# Patient Record
Sex: Female | Born: 1945 | Race: White | Hispanic: No | Marital: Married | Smoking: Never smoker
Health system: Southern US, Community
[De-identification: ages and names within clinical notes are randomized; demographics above are authoritative.]

## PROBLEM LIST (undated history)

## (undated) DIAGNOSIS — M1990 Unspecified osteoarthritis, unspecified site: Secondary | ICD-10-CM

## (undated) DIAGNOSIS — K219 Gastro-esophageal reflux disease without esophagitis: Secondary | ICD-10-CM

## (undated) DIAGNOSIS — E785 Hyperlipidemia, unspecified: Secondary | ICD-10-CM

## (undated) DIAGNOSIS — H269 Unspecified cataract: Secondary | ICD-10-CM

## (undated) DIAGNOSIS — Z5189 Encounter for other specified aftercare: Secondary | ICD-10-CM

## (undated) DIAGNOSIS — G4730 Sleep apnea, unspecified: Secondary | ICD-10-CM

## (undated) DIAGNOSIS — G709 Myoneural disorder, unspecified: Secondary | ICD-10-CM

## (undated) DIAGNOSIS — M4800 Spinal stenosis, site unspecified: Secondary | ICD-10-CM

## (undated) DIAGNOSIS — T8859XA Other complications of anesthesia, initial encounter: Secondary | ICD-10-CM

## (undated) DIAGNOSIS — T4145XA Adverse effect of unspecified anesthetic, initial encounter: Secondary | ICD-10-CM

## (undated) DIAGNOSIS — I10 Essential (primary) hypertension: Secondary | ICD-10-CM

## (undated) HISTORY — PX: CARPAL TUNNEL RELEASE: SHX101

## (undated) HISTORY — DX: Unspecified cataract: H26.9

## (undated) HISTORY — PX: COLONOSCOPY: SHX174

## (undated) HISTORY — PX: CATARACT EXTRACTION, BILATERAL: SHX1313

## (undated) HISTORY — DX: Myoneural disorder, unspecified: G70.9

## (undated) HISTORY — DX: Encounter for other specified aftercare: Z51.89

## (undated) HISTORY — PX: ACHILLES TENDON REPAIR: SUR1153

## (undated) HISTORY — DX: Sleep apnea, unspecified: G47.30

---

## 1998-06-07 ENCOUNTER — Ambulatory Visit (HOSPITAL_BASED_OUTPATIENT_CLINIC_OR_DEPARTMENT_OTHER): Admission: RE | Admit: 1998-06-07 | Discharge: 1998-06-07 | Payer: Self-pay | Admitting: Orthopedic Surgery

## 2002-12-31 ENCOUNTER — Encounter: Admission: RE | Admit: 2002-12-31 | Discharge: 2002-12-31 | Payer: Self-pay | Admitting: Specialist

## 2002-12-31 ENCOUNTER — Encounter: Payer: Self-pay | Admitting: Specialist

## 2003-03-16 ENCOUNTER — Other Ambulatory Visit: Admission: RE | Admit: 2003-03-16 | Discharge: 2003-03-16 | Payer: Self-pay | Admitting: Family Medicine

## 2004-06-14 ENCOUNTER — Ambulatory Visit: Payer: Self-pay | Admitting: Family Medicine

## 2004-08-01 ENCOUNTER — Ambulatory Visit: Payer: Self-pay | Admitting: Family Medicine

## 2004-11-20 ENCOUNTER — Ambulatory Visit: Payer: Self-pay | Admitting: Family Medicine

## 2004-11-27 ENCOUNTER — Ambulatory Visit: Payer: Self-pay | Admitting: Family Medicine

## 2005-03-22 ENCOUNTER — Ambulatory Visit: Payer: Self-pay | Admitting: Family Medicine

## 2005-05-22 ENCOUNTER — Ambulatory Visit: Payer: Self-pay | Admitting: Family Medicine

## 2012-06-02 ENCOUNTER — Other Ambulatory Visit: Payer: Self-pay | Admitting: Specialist

## 2012-06-02 DIAGNOSIS — M4800 Spinal stenosis, site unspecified: Secondary | ICD-10-CM

## 2012-06-02 DIAGNOSIS — M545 Low back pain: Secondary | ICD-10-CM

## 2012-06-10 ENCOUNTER — Other Ambulatory Visit: Payer: Self-pay

## 2012-06-13 ENCOUNTER — Inpatient Hospital Stay
Admission: RE | Admit: 2012-06-13 | Discharge: 2012-06-13 | Disposition: A | Discharge status: Home / Self Care | Payer: Self-pay | Source: Ambulatory Visit | Attending: Specialist | Admitting: Specialist

## 2012-06-13 ENCOUNTER — Ambulatory Visit
Admission: RE | Admit: 2012-06-13 | Discharge: 2012-06-13 | Disposition: A | Discharge status: Home / Self Care | Payer: BC Managed Care – PPO | Source: Ambulatory Visit | Attending: Specialist | Admitting: Specialist

## 2012-06-13 ENCOUNTER — Other Ambulatory Visit: Payer: Self-pay | Admitting: Specialist

## 2012-06-13 ENCOUNTER — Other Ambulatory Visit: Payer: Self-pay

## 2012-06-13 DIAGNOSIS — M545 Low back pain: Secondary | ICD-10-CM

## 2012-06-13 DIAGNOSIS — M4800 Spinal stenosis, site unspecified: Secondary | ICD-10-CM

## 2012-06-13 DIAGNOSIS — R52 Pain, unspecified: Secondary | ICD-10-CM

## 2012-06-16 ENCOUNTER — Ambulatory Visit
Admission: RE | Admit: 2012-06-16 | Discharge: 2012-06-16 | Disposition: A | Discharge status: Home / Self Care | Payer: BC Managed Care – PPO | Source: Ambulatory Visit | Attending: Specialist | Admitting: Specialist

## 2012-06-16 VITALS — BP 129/80 | HR 83

## 2012-06-16 DIAGNOSIS — M545 Low back pain: Secondary | ICD-10-CM

## 2012-06-16 DIAGNOSIS — M4800 Spinal stenosis, site unspecified: Secondary | ICD-10-CM

## 2012-06-16 MED ORDER — IOHEXOL 180 MG/ML  SOLN: 15.0000 mL | Freq: Once | INTRAMUSCULAR | Status: DC | PRN

## 2012-06-16 MED ORDER — DIAZEPAM 5 MG PO TABS
5.0000 mg | ORAL_TABLET | Freq: Once | ORAL | Status: AC
  Administered 2012-06-16: 5 mg via ORAL

## 2012-06-16 NOTE — Progress Notes (Signed)
Patient states she has been Nortriptyline for at least the past two days.  Donell Sievert, RN

## 2012-07-08 ENCOUNTER — Other Ambulatory Visit: Payer: Self-pay | Admitting: Orthopedic Surgery

## 2012-07-17 ENCOUNTER — Encounter (HOSPITAL_COMMUNITY): Payer: Self-pay | Admitting: Pharmacy Technician

## 2012-07-21 NOTE — Progress Notes (Signed)
Chest x ray, EKG, OV Dr Thomasena Edis 06/30/12  chart

## 2012-07-21 NOTE — Patient Instructions (Addendum)
[name]  07/21/2012   Your procedure is scheduled on:  07/30/12  [facility]  Report to [facility] at 0530      AM.  Call this number if you have problems the morning of surgery: [phone]       Remember:   Do not eat food  Or drink :After Midnight.TUESDAY NIGHT   Take these medicines the morning of surgery with A SIP OF WATER:NONE   .  Contacts, dentures or partial plates can not be worn to surgery  Leave suitcase in the car. After surgery it may be brought to your room.  For patients admitted to the hospital, checkout time is 11:00 AM day of  discharge.             SPECIAL INSTRUCTIONS- SEE [place] PREPARING FOR SURGERY INSTRUCTION SHEET-     DO NOT WEAR JEWELRY, LOTIONS, POWDERS, OR PERFUMES.  WOMEN-- DO NOT SHAVE LEGS OR UNDERARMS FOR 12 HOURS BEFORE SHOWERS. MEN MAY SHAVE FACE.  Patients discharged the day of surgery will not be allowed to drive home. IF going home the day of surgery, you must have a driver and someone to stay with you for the first 24 hours  Name and phone number of your driver:   admission     Husband [name]                                                                  Please read over the following fact sheets that you were given: MRSA Information, Incentive Spirometry Sheet,                                                                                  [name]  PST 336  [phone]                 FAILURE TO FOLLOW THESE INSTRUCTIONS MAY RESULT IN  CANCELLATION   OF YOUR SURGERY                                                  Patient Signature _____________________________

## 2012-07-22 ENCOUNTER — Encounter (HOSPITAL_COMMUNITY): Payer: Self-pay

## 2012-07-22 ENCOUNTER — Ambulatory Visit (HOSPITAL_COMMUNITY)
Admission: RE | Admit: 2012-07-22 | Discharge: 2012-07-22 | Disposition: A | Discharge status: Home / Self Care | Payer: BC Managed Care – PPO | Source: Ambulatory Visit | Attending: Orthopedic Surgery | Admitting: Orthopedic Surgery

## 2012-07-22 ENCOUNTER — Encounter (HOSPITAL_COMMUNITY)
Admission: RE | Admit: 2012-07-22 | Discharge: 2012-07-22 | Disposition: A | Discharge status: Home / Self Care | Payer: BC Managed Care – PPO | Source: Ambulatory Visit | Attending: Specialist | Admitting: Specialist

## 2012-07-22 DIAGNOSIS — Z01812 Encounter for preprocedural laboratory examination: Secondary | ICD-10-CM | POA: Insufficient documentation

## 2012-07-22 DIAGNOSIS — M48061 Spinal stenosis, lumbar region without neurogenic claudication: Secondary | ICD-10-CM | POA: Insufficient documentation

## 2012-07-22 HISTORY — DX: Adverse effect of unspecified anesthetic, initial encounter: T41.45XA

## 2012-07-22 HISTORY — DX: Other complications of anesthesia, initial encounter: T88.59XA

## 2012-07-22 HISTORY — DX: Spinal stenosis, site unspecified: M48.00

## 2012-07-22 HISTORY — DX: Hyperlipidemia, unspecified: E78.5

## 2012-07-22 HISTORY — DX: Gastro-esophageal reflux disease without esophagitis: K21.9

## 2012-07-22 HISTORY — DX: Unspecified osteoarthritis, unspecified site: M19.90

## 2012-07-22 HISTORY — DX: Essential (primary) hypertension: I10

## 2012-07-22 LAB — CBC
Hemoglobin: 14.6 g/dL (ref 12.0–15.0)
MCH: 31.5 pg (ref 26.0–34.0)
MCHC: 33 g/dL (ref 30.0–36.0)
MCV: 95.5 fL (ref 78.0–100.0)
Platelets: 364 10*3/uL (ref 150–400)
RBC: 4.63 MIL/uL (ref 3.87–5.11)

## 2012-07-22 LAB — BASIC METABOLIC PANEL
BUN: 16 mg/dL (ref 6–23)
CO2: 27 mEq/L (ref 19–32)
Calcium: 10.3 mg/dL (ref 8.4–10.5)
Glucose, Bld: 94 mg/dL (ref 70–99)
Sodium: 140 mEq/L (ref 135–145)

## 2012-07-29 NOTE — Anesthesia Preprocedure Evaluation (Addendum)
Anesthesia Evaluation  Patient identified by MRN, date of birth, ID band Patient awake    Reviewed: Allergy & Precautions, H&P , NPO status , Patient's Chart, lab work & pertinent test results  History of Anesthesia Complications (+) PROLONGED EMERGENCE  Airway Mallampati: II TM Distance: >3 FB Neck ROM: Full    Dental  (+) Partial Upper, Partial Lower and Dental Advisory Given   Pulmonary neg pulmonary ROS,  breath sounds clear to auscultation  Pulmonary exam normal       Cardiovascular hypertension, Pt. on medications negative cardio ROS  Rhythm:Regular Rate:Normal     Neuro/Psych Spinal stenosis negative neurological ROS  negative psych ROS   GI/Hepatic Neg liver ROS, GERD-  Medicated,  Endo/Other  negative endocrine ROS  Renal/GU negative Renal ROS  negative genitourinary   Musculoskeletal negative musculoskeletal ROS (+)   Abdominal   Peds  Hematology negative hematology ROS (+)   Anesthesia Other Findings   Reproductive/Obstetrics negative OB ROS                          Anesthesia Physical Anesthesia Plan  ASA: II  Anesthesia Plan: General   Post-op Pain Management:    Induction: Intravenous  Airway Management Planned: Oral ETT  Additional Equipment:   Intra-op Plan:   Post-operative Plan: Extubation in OR  Informed Consent: I have reviewed the patients History and Physical, chart, labs and discussed the procedure including the risks, benefits and alternatives for the proposed anesthesia with the patient or authorized representative who has indicated his/her understanding and acceptance.   Dental advisory given  Plan Discussed with: CRNA  Anesthesia Plan Comments:         Anesthesia Quick Evaluation

## 2012-07-30 ENCOUNTER — Encounter (HOSPITAL_COMMUNITY)
Admission: RE | Disposition: A | Discharge status: Home / Self Care | Payer: Self-pay | Source: Ambulatory Visit | Attending: Specialist

## 2012-07-30 ENCOUNTER — Ambulatory Visit (HOSPITAL_COMMUNITY): Payer: BC Managed Care – PPO

## 2012-07-30 ENCOUNTER — Encounter (HOSPITAL_COMMUNITY): Payer: Self-pay | Admitting: *Deleted

## 2012-07-30 ENCOUNTER — Ambulatory Visit (HOSPITAL_COMMUNITY): Payer: BC Managed Care – PPO | Admitting: Anesthesiology

## 2012-07-30 ENCOUNTER — Inpatient Hospital Stay (HOSPITAL_COMMUNITY)
Admission: RE | Admit: 2012-07-30 | Discharge: 2012-08-01 | DRG: 758 | Disposition: A | Discharge status: Home / Self Care | Payer: BC Managed Care – PPO | Source: Ambulatory Visit | Attending: Specialist | Admitting: Specialist

## 2012-07-30 ENCOUNTER — Encounter (HOSPITAL_COMMUNITY): Payer: Self-pay | Admitting: Anesthesiology

## 2012-07-30 DIAGNOSIS — M129 Arthropathy, unspecified: Secondary | ICD-10-CM | POA: Diagnosis present

## 2012-07-30 DIAGNOSIS — M48062 Spinal stenosis, lumbar region with neurogenic claudication: Principal | ICD-10-CM | POA: Diagnosis present

## 2012-07-30 DIAGNOSIS — I10 Essential (primary) hypertension: Secondary | ICD-10-CM | POA: Diagnosis present

## 2012-07-30 DIAGNOSIS — K219 Gastro-esophageal reflux disease without esophagitis: Secondary | ICD-10-CM | POA: Diagnosis present

## 2012-07-30 DIAGNOSIS — Z79899 Other long term (current) drug therapy: Secondary | ICD-10-CM

## 2012-07-30 DIAGNOSIS — Z885 Allergy status to narcotic agent status: Secondary | ICD-10-CM

## 2012-07-30 DIAGNOSIS — E785 Hyperlipidemia, unspecified: Secondary | ICD-10-CM | POA: Diagnosis present

## 2012-07-30 DIAGNOSIS — R110 Nausea: Secondary | ICD-10-CM | POA: Diagnosis not present

## 2012-07-30 DIAGNOSIS — Z7982 Long term (current) use of aspirin: Secondary | ICD-10-CM

## 2012-07-30 HISTORY — PX: LUMBAR LAMINECTOMY/DECOMPRESSION MICRODISCECTOMY: SHX5026

## 2012-07-30 SURGERY — LUMBAR LAMINECTOMY/DECOMPRESSION MICRODISCECTOMY 2 LEVELS
Anesthesia: General | Wound class: Clean

## 2012-07-30 MED ORDER — PROPOFOL 10 MG/ML IV BOLUS
INTRAVENOUS | Status: DC | PRN
  Administered 2012-07-30: 120 mg via INTRAVENOUS

## 2012-07-30 MED ORDER — LIDOCAINE HCL (CARDIAC) 20 MG/ML IV SOLN
INTRAVENOUS | Status: DC | PRN
  Administered 2012-07-30: 50 mg via INTRAVENOUS

## 2012-07-30 MED ORDER — CEFAZOLIN SODIUM-DEXTROSE 2-3 GM-% IV SOLR
2.0000 g | INTRAVENOUS | Status: AC
  Administered 2012-07-30: 2 g via INTRAVENOUS

## 2012-07-30 MED ORDER — LISINOPRIL 5 MG PO TABS
5.0000 mg | ORAL_TABLET | Freq: Every day | ORAL | Status: DC
  Filled 2012-07-30 (×3): qty 1

## 2012-07-30 MED ORDER — BISACODYL 5 MG PO TBEC
5.0000 mg | DELAYED_RELEASE_TABLET | ORAL | Status: DC
  Filled 2012-07-30: qty 1

## 2012-07-30 MED ORDER — DEXAMETHASONE SODIUM PHOSPHATE 4 MG/ML IJ SOLN
INTRAMUSCULAR | Status: DC | PRN
  Administered 2012-07-30: 10 mg via INTRAVENOUS

## 2012-07-30 MED ORDER — PHENOL 1.4 % MT LIQD: 1.0000 | OROMUCOSAL | Status: DC | PRN

## 2012-07-30 MED ORDER — BUPIVACAINE-EPINEPHRINE 0.5% -1:200000 IJ SOLN
INTRAMUSCULAR | Status: DC | PRN
  Administered 2012-07-30: 20 mL

## 2012-07-30 MED ORDER — NORTRIPTYLINE HCL 25 MG PO CAPS
25.0000 mg | ORAL_CAPSULE | Freq: Every day | ORAL | Status: DC
  Administered 2012-07-30 – 2012-07-31 (×2): 25 mg via ORAL
  Filled 2012-07-30 (×5): qty 1

## 2012-07-30 MED ORDER — ACETAMINOPHEN 10 MG/ML IV SOLN
INTRAVENOUS | Status: DC | PRN
  Administered 2012-07-30: 1000 mg via INTRAVENOUS

## 2012-07-30 MED ORDER — MORPHINE SULFATE 10 MG/ML IJ SOLN: 1.0000 mg | INTRAMUSCULAR | Status: DC | PRN

## 2012-07-30 MED ORDER — HYDROMORPHONE HCL PF 1 MG/ML IJ SOLN
0.2500 mg | INTRAMUSCULAR | Status: DC | PRN
  Administered 2012-07-30: 0.5 mg via INTRAVENOUS

## 2012-07-30 MED ORDER — ACETAMINOPHEN 10 MG/ML IV SOLN
INTRAVENOUS | Status: AC
  Filled 2012-07-30: qty 100

## 2012-07-30 MED ORDER — THROMBIN 5000 UNITS EX SOLR
CUTANEOUS | Status: DC | PRN
  Administered 2012-07-30: 10000 [IU] via TOPICAL

## 2012-07-30 MED ORDER — ACETAMINOPHEN 325 MG PO TABS: 650.0000 mg | ORAL_TABLET | ORAL | Status: DC | PRN

## 2012-07-30 MED ORDER — KCL IN DEXTROSE-NACL 20-5-0.9 MEQ/L-%-% IV SOLN
INTRAVENOUS | Status: AC
  Administered 2012-07-30 – 2012-07-31 (×2): via INTRAVENOUS
  Filled 2012-07-30 (×2): qty 1000

## 2012-07-30 MED ORDER — SODIUM CHLORIDE 0.9 % IV BOLUS (SEPSIS)
500.0000 mL | Freq: Once | INTRAVENOUS | Status: AC
  Administered 2012-07-30: 500 mL via INTRAVENOUS

## 2012-07-30 MED ORDER — LACTATED RINGERS IV SOLN
INTRAVENOUS | Status: DC | PRN
  Administered 2012-07-30 (×2): via INTRAVENOUS

## 2012-07-30 MED ORDER — METOCLOPRAMIDE HCL 5 MG/ML IJ SOLN
INTRAMUSCULAR | Status: DC | PRN
  Administered 2012-07-30: 10 mg via INTRAVENOUS

## 2012-07-30 MED ORDER — SODIUM CHLORIDE 0.9 % IV SOLN: 250.0000 mL | INTRAVENOUS | Status: DC

## 2012-07-30 MED ORDER — CEFAZOLIN SODIUM-DEXTROSE 2-3 GM-% IV SOLR
INTRAVENOUS | Status: AC
  Filled 2012-07-30: qty 50

## 2012-07-30 MED ORDER — GLYCOPYRROLATE 0.2 MG/ML IJ SOLN
INTRAMUSCULAR | Status: DC | PRN
  Administered 2012-07-30: 0.6 mg via INTRAVENOUS

## 2012-07-30 MED ORDER — BUPIVACAINE-EPINEPHRINE (PF) 0.5% -1:200000 IJ SOLN
INTRAMUSCULAR | Status: AC
  Filled 2012-07-30: qty 10

## 2012-07-30 MED ORDER — LACTATED RINGERS IV SOLN: INTRAVENOUS | Status: DC

## 2012-07-30 MED ORDER — FENTANYL CITRATE 0.05 MG/ML IJ SOLN
INTRAMUSCULAR | Status: DC | PRN
  Administered 2012-07-30 (×3): 25 ug via INTRAVENOUS
  Administered 2012-07-30 (×3): 50 ug via INTRAVENOUS
  Administered 2012-07-30: 25 ug via INTRAVENOUS

## 2012-07-30 MED ORDER — ONDANSETRON HCL 4 MG PO TABS: 4.0000 mg | ORAL_TABLET | Freq: Three times a day (TID) | ORAL | Status: DC | PRN

## 2012-07-30 MED ORDER — ACETAMINOPHEN 650 MG RE SUPP: 650.0000 mg | RECTAL | Status: DC | PRN

## 2012-07-30 MED ORDER — HYDROCODONE-ACETAMINOPHEN 5-325 MG PO TABS: 1.0000 | ORAL_TABLET | ORAL | Status: DC | PRN

## 2012-07-30 MED ORDER — ARTIFICIAL TEARS OP OINT
TOPICAL_OINTMENT | OPHTHALMIC | Status: DC | PRN
  Administered 2012-07-30: 1 via OPHTHALMIC

## 2012-07-30 MED ORDER — PROMETHAZINE HCL 25 MG/ML IJ SOLN: 6.2500 mg | INTRAMUSCULAR | Status: DC | PRN

## 2012-07-30 MED ORDER — SODIUM CHLORIDE 0.9 % IJ SOLN: 3.0000 mL | Freq: Two times a day (BID) | INTRAMUSCULAR | Status: DC

## 2012-07-30 MED ORDER — HYDROCODONE-ACETAMINOPHEN 5-325 MG PO TABS
1.0000 | ORAL_TABLET | ORAL | Status: DC | PRN
  Administered 2012-07-30: 1 via ORAL
  Administered 2012-07-30: 2 via ORAL
  Administered 2012-07-30: 1 via ORAL
  Administered 2012-07-31 – 2012-08-01 (×8): 2 via ORAL
  Filled 2012-07-30: qty 1
  Filled 2012-07-30 (×6): qty 2
  Filled 2012-07-30: qty 1
  Filled 2012-07-30 (×3): qty 2

## 2012-07-30 MED ORDER — ROCURONIUM BROMIDE 100 MG/10ML IV SOLN
INTRAVENOUS | Status: DC | PRN
  Administered 2012-07-30: 10 mg via INTRAVENOUS
  Administered 2012-07-30: 5 mg via INTRAVENOUS
  Administered 2012-07-30: 30 mg via INTRAVENOUS
  Administered 2012-07-30: 5 mg via INTRAVENOUS
  Administered 2012-07-30: 10 mg via INTRAVENOUS

## 2012-07-30 MED ORDER — ZOLPIDEM TARTRATE 5 MG PO TABS
5.0000 mg | ORAL_TABLET | Freq: Every day | ORAL | Status: DC
  Administered 2012-07-30 – 2012-07-31 (×2): 5 mg via ORAL
  Filled 2012-07-30 (×2): qty 1

## 2012-07-30 MED ORDER — SODIUM CHLORIDE 0.9 % IR SOLN
Status: DC | PRN
  Administered 2012-07-30: 09:00:00

## 2012-07-30 MED ORDER — KCL IN DEXTROSE-NACL 20-5-0.9 MEQ/L-%-% IV SOLN
INTRAVENOUS | Status: DC
  Administered 2012-07-30: 50 mL/h via INTRAVENOUS
  Filled 2012-07-30: qty 1000

## 2012-07-30 MED ORDER — MENTHOL 3 MG MT LOZG: 1.0000 | LOZENGE | OROMUCOSAL | Status: DC | PRN

## 2012-07-30 MED ORDER — ONDANSETRON HCL 4 MG/2ML IJ SOLN: 4.0000 mg | INTRAMUSCULAR | Status: DC | PRN

## 2012-07-30 MED ORDER — ONDANSETRON HCL 4 MG/2ML IJ SOLN
INTRAMUSCULAR | Status: DC | PRN
  Administered 2012-07-30: 4 mg via INTRAVENOUS

## 2012-07-30 MED ORDER — PROMETHAZINE HCL 25 MG/ML IJ SOLN
12.5000 mg | Freq: Four times a day (QID) | INTRAMUSCULAR | Status: DC | PRN
  Administered 2012-07-30: 12.5 mg via INTRAVENOUS
  Filled 2012-07-30 (×2): qty 1

## 2012-07-30 MED ORDER — THROMBIN 5000 UNITS EX SOLR
CUTANEOUS | Status: AC
  Filled 2012-07-30: qty 10000

## 2012-07-30 MED ORDER — NEOSTIGMINE METHYLSULFATE 1 MG/ML IJ SOLN
INTRAMUSCULAR | Status: DC | PRN
  Administered 2012-07-30: 5 mg via INTRAVENOUS

## 2012-07-30 MED ORDER — HYDROMORPHONE HCL PF 1 MG/ML IJ SOLN
INTRAMUSCULAR | Status: AC
  Filled 2012-07-30: qty 1

## 2012-07-30 MED ORDER — SODIUM CHLORIDE 0.9 % IJ SOLN: 3.0000 mL | INTRAMUSCULAR | Status: DC | PRN

## 2012-07-30 MED ORDER — CEFAZOLIN SODIUM-DEXTROSE 2-3 GM-% IV SOLR
2.0000 g | Freq: Three times a day (TID) | INTRAVENOUS | Status: AC
  Administered 2012-07-30 – 2012-07-31 (×3): 2 g via INTRAVENOUS
  Filled 2012-07-30 (×3): qty 50

## 2012-07-30 MED ORDER — METHOCARBAMOL 100 MG/ML IJ SOLN
500.0000 mg | Freq: Four times a day (QID) | INTRAVENOUS | Status: DC | PRN
  Filled 2012-07-30: qty 5

## 2012-07-30 MED ORDER — PHENYLEPHRINE HCL 10 MG/ML IJ SOLN
INTRAMUSCULAR | Status: DC | PRN
  Administered 2012-07-30 (×2): 20 ug via INTRAVENOUS
  Administered 2012-07-30: 40 ug via INTRAVENOUS
  Administered 2012-07-30: 80 ug via INTRAVENOUS
  Administered 2012-07-30: 20 ug via INTRAVENOUS
  Administered 2012-07-30: 40 ug via INTRAVENOUS

## 2012-07-30 MED ORDER — DOCUSATE SODIUM 100 MG PO CAPS
100.0000 mg | ORAL_CAPSULE | Freq: Two times a day (BID) | ORAL | Status: DC
  Administered 2012-07-30 – 2012-08-01 (×4): 100 mg via ORAL

## 2012-07-30 MED ORDER — HYDROMORPHONE HCL PF 1 MG/ML IJ SOLN
0.5000 mg | INTRAMUSCULAR | Status: DC | PRN
Start: 2012-07-30 — End: 2012-08-01

## 2012-07-30 MED ORDER — HEMOSTATIC AGENTS (NO CHARGE) OPTIME
TOPICAL | Status: DC | PRN
  Administered 2012-07-30: 1 via TOPICAL

## 2012-07-30 MED ORDER — METHOCARBAMOL 500 MG PO TABS
500.0000 mg | ORAL_TABLET | Freq: Four times a day (QID) | ORAL | Status: DC | PRN
  Administered 2012-07-30: 500 mg via ORAL
  Filled 2012-07-30 (×2): qty 1

## 2012-07-30 SURGICAL SUPPLY — 53 items
BAG ZIPLOCK 12X15 (MISCELLANEOUS) ×2 IMPLANT
BENZOIN TINCTURE PRP APPL 2/3 (GAUZE/BANDAGES/DRESSINGS) ×2 IMPLANT
CHLORAPREP W/TINT 26ML (MISCELLANEOUS) IMPLANT
CLEANER TIP ELECTROSURG 2X2 (MISCELLANEOUS) ×2 IMPLANT
CLOTH BEACON ORANGE TIMEOUT ST (SAFETY) ×2 IMPLANT
DECANTER SPIKE VIAL GLASS SM (MISCELLANEOUS) ×2 IMPLANT
DRAPE LG THREE QUARTER DISP (DRAPES) ×10 IMPLANT
DRAPE MICROSCOPE LEICA (MISCELLANEOUS) ×2 IMPLANT
DRAPE POUCH INSTRU U-SHP 10X18 (DRAPES) ×2 IMPLANT
DRAPE SURG 17X11 SM STRL (DRAPES) ×2 IMPLANT
DRSG AQUACEL AG ADV 3.5X 4 (GAUZE/BANDAGES/DRESSINGS) ×2 IMPLANT
DRSG AQUACEL AG ADV 3.5X10 (GAUZE/BANDAGES/DRESSINGS) ×2 IMPLANT
DRSG EMULSION OIL 3X3 NADH (GAUZE/BANDAGES/DRESSINGS) IMPLANT
DRSG PAD ABDOMINAL 8X10 ST (GAUZE/BANDAGES/DRESSINGS) IMPLANT
DRSG TELFA 4X5 ISLAND ADH (GAUZE/BANDAGES/DRESSINGS) IMPLANT
DURAPREP 26ML APPLICATOR (WOUND CARE) ×2 IMPLANT
ELECT REM PT RETURN 9FT ADLT (ELECTROSURGICAL) ×2
ELECTRODE REM PT RTRN 9FT ADLT (ELECTROSURGICAL) ×1 IMPLANT
GLOVE BIOGEL PI IND STRL 7.5 (GLOVE) ×1 IMPLANT
GLOVE BIOGEL PI IND STRL 8 (GLOVE) ×2 IMPLANT
GLOVE BIOGEL PI INDICATOR 7.5 (GLOVE) ×1
GLOVE BIOGEL PI INDICATOR 8 (GLOVE) ×2
GLOVE SURG SS PI 7.5 STRL IVOR (GLOVE) ×2 IMPLANT
GLOVE SURG SS PI 8.0 STRL IVOR (GLOVE) ×8 IMPLANT
GOWN PREVENTION PLUS LG XLONG (DISPOSABLE) ×2 IMPLANT
GOWN STRL REIN XL XLG (GOWN DISPOSABLE) ×4 IMPLANT
HOLDER FOLEY CATH W/STRAP (MISCELLANEOUS) ×2 IMPLANT
KIT BASIN OR (CUSTOM PROCEDURE TRAY) ×2 IMPLANT
KIT POSITIONING SURG ANDREWS (MISCELLANEOUS) ×2 IMPLANT
MANIFOLD NEPTUNE II (INSTRUMENTS) ×2 IMPLANT
NEEDLE SPNL 18GX3.5 QUINCKE PK (NEEDLE) ×6 IMPLANT
PATTIES SURGICAL .5 X.5 (GAUZE/BANDAGES/DRESSINGS) ×2 IMPLANT
PATTIES SURGICAL .75X.75 (GAUZE/BANDAGES/DRESSINGS) IMPLANT
PATTIES SURGICAL 1X1 (DISPOSABLE) IMPLANT
SPONGE LAP 4X18 X RAY DECT (DISPOSABLE) ×10 IMPLANT
SPONGE SURGIFOAM ABS GEL 100 (HEMOSTASIS) ×2 IMPLANT
STAPLER VISISTAT (STAPLE) ×2 IMPLANT
STRIP CLOSURE SKIN 1/2X4 (GAUZE/BANDAGES/DRESSINGS) IMPLANT
STRIP CLOSURE SKIN 1/4X4 (GAUZE/BANDAGES/DRESSINGS) ×2 IMPLANT
SUT PROLENE 3 0 PS 2 (SUTURE) IMPLANT
SUT VIC AB 0 CT1 27 (SUTURE)
SUT VIC AB 0 CT1 27XBRD ANTBC (SUTURE) IMPLANT
SUT VIC AB 1 CT1 27 (SUTURE) ×4
SUT VIC AB 1 CT1 27XBRD ANTBC (SUTURE) ×4 IMPLANT
SUT VIC AB 1-0 CT2 27 (SUTURE) IMPLANT
SUT VIC AB 2-0 CT1 27 (SUTURE) ×2
SUT VIC AB 2-0 CT1 TAPERPNT 27 (SUTURE) ×2 IMPLANT
SUT VIC AB 2-0 CT2 27 (SUTURE) ×6 IMPLANT
SUT VICRYL 0 27 CT2 27 ABS (SUTURE) ×4 IMPLANT
SUT VICRYL 0 UR6 27IN ABS (SUTURE) IMPLANT
SYRINGE 10CC LL (SYRINGE) ×4 IMPLANT
TRAY LAMINECTOMY (CUSTOM PROCEDURE TRAY) ×2 IMPLANT
YANKAUER SUCT BULB TIP NO VENT (SUCTIONS) ×2 IMPLANT

## 2012-07-30 NOTE — Brief Op Note (Signed)
07/30/2012  10:30 AM  PATIENT:  [name]  67 y.o. female  PRE-OPERATIVE DIAGNOSIS:  Stenosis L1 - S1  POST-OPERATIVE DIAGNOSIS:  Stenosis L1 - S1  PROCEDURE:  Procedure(s) with comments: Lumbar Decompression L1 - S1 (X-Ray)4 LEVEL (N/A) - Lumbar Decompression L1 - S1 (X-Ray)  SURGEON:  Surgeon(s) and Role:    * Javier Docker, MD - Primary    * Jacki Cones, MD - Assisting  PHYSICIAN ASSISTANT:   ASSISTANTS: Gioffre   ANESTHESIA:   general  EBL:  Total I/O In: 1000 [I.V.:1000] Out: 435 [Urine:175; Blood:260]  BLOOD ADMINISTERED:none  DRAINS: none   LOCAL MEDICATIONS USED:  MARCAINE     SPECIMEN:  No Specimen  DISPOSITION OF SPECIMEN:  N/A  COUNTS:  YES  TOURNIQUET:  * No tourniquets in log *  DICTATION: .Other Dictation: Dictation Number [id number]  PLAN OF CARE: Admit to inpatient   PATIENT DISPOSITION:  PACU - hemodynamically stable.   Delay start of Pharmacological VTE agent (>24hrs) due to surgical blood loss or risk of bleeding: yes

## 2012-07-30 NOTE — Transfer of Care (Signed)
Immediate Anesthesia Transfer of Care Note  Patient: [name]  Procedure(s) Performed: Procedure(s) with comments: Lumbar Decompression L1 - S1 (X-Ray)4 LEVEL (N/A) - Lumbar Decompression L1 - S1 (X-Ray)  Patient Location: PACU  Anesthesia Type:General  Level of Consciousness: awake, oriented, patient cooperative and responds to stimulation  Airway & Oxygen Therapy: Patient Spontanous Breathing and Patient connected to face mask oxygen  Post-op Assessment: Report given to PACU RN, Post -op Vital signs reviewed and stable and Patient moving all extremities X 4  Post vital signs: Reviewed and stable  Complications: No apparent anesthesia complications

## 2012-07-30 NOTE — H&P (Signed)
[name] is an 67 y.o. female.   Chief Complaint: left greater than right leg pain HPI: Refractory stenosis multifactorial.   Past Medical History  Diagnosis Date  . Spinal stenosis   . Hypertension   . Hyperlipidemia   . GERD (gastroesophageal reflux disease)   . Arthritis   . Complication of anesthesia     "slow to wake up"    Past Surgical History  Procedure Laterality Date  . Achilles tendon repair      right  . Carpal tunnel release Bilateral     History reviewed. No pertinent family history. Social History:  reports that she has never smoked. She has never used smokeless tobacco. She reports that she does not drink alcohol or use illicit drugs.  Allergies:  Allergies  Allergen Reactions  . Percocet (Oxycodone-Acetaminophen) Nausea Only    Medications Prior to Admission  Medication Sig Dispense Refill  . aspirin EC 81 MG tablet Take 81 mg by mouth 2 (two) times daily with a meal. States last dose will be 07/23/12      . bisacodyl (BISACODYL) 5 MG EC tablet Take 5 mg by mouth 3 (three) times a week. For constipation      . Calcium Carbonate-Vitamin D (CALCIUM 600 + D PO) Take 1 tablet by mouth 2 (two) times daily with a meal.      . dimenhyDRINATE (DRAMAMINE) 50 MG tablet Take 50 mg by mouth at bedtime as needed (fo nausea).      Marland Kitchen lisinopril (PRINIVIL,ZESTRIL) 5 MG tablet Take 5 mg by mouth daily after breakfast.      . Multiple Vitamin (MULTIVITAMIN WITH MINERALS) TABS Take 0.5 tablets by mouth 2 (two) times daily with a meal. States will stop 07/23/12      . nortriptyline (PAMELOR) 25 MG capsule Take 25 mg by mouth at bedtime.      . Omega-3 Fatty Acids (FISH OIL) 1200 MG CAPS Take 1,200 mg by mouth 2 (two) times daily with a meal.      . piroxicam (FELDENE) 20 MG capsule Take 20 mg by mouth daily after supper. States last dose 07/23/12      . pravastatin (PRAVACHOL) 40 MG tablet Take 80 mg by mouth daily after supper.      . zolpidem (AMBIEN) 10 MG tablet Take  10 mg by mouth at bedtime.        No results found for this or any previous visit (from the past 48 hour(s)). No results found.  Review of Systems  Musculoskeletal: Positive for back pain.  Neurological: Positive for focal weakness.  All other systems reviewed and are negative.    Blood pressure 115/79, pulse 100, temperature 97.5 F (36.4 C), temperature source Oral, resp. rate 18, SpO2 97.00%. Physical Exam  Vitals reviewed. Constitutional: She appears well-developed.  HENT:  Head: Normocephalic.  Eyes: Pupils are equal, round, and reactive to light.  Neck: Normal range of motion.  Cardiovascular: Normal rate.   Respiratory: Effort normal.  GI: Soft. Bowel sounds are normal.  Musculoskeletal: Normal range of motion.  +SLR bilat. Quad 4+/5 bilat. EHL 5-/5.  No DVT. 1+ pulses.  Neurological: She is alert.  Skin: Skin is warm and dry.  Psychiatric: She has a normal mood and affect.   MRI CT myelogram severe stenosis L12 to L45.  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 Plan decompression. Risks discussed.  Avielle Imbert C 07/30/2012, 7:21 AM

## 2012-07-30 NOTE — Preoperative (Signed)
Beta Blockers   Reason not to administer Beta Blockers:Not Applicable, not on home bb 

## 2012-07-30 NOTE — Anesthesia Postprocedure Evaluation (Signed)
Anesthesia Post Note  Patient: [name]  Procedure(s) Performed: Procedure(s) (LRB): Lumbar Decompression L1 - S1 (X-Ray)4 LEVEL (N/A)  Anesthesia type: General  Patient location: PACU  Post pain: Pain level controlled  Post assessment: Post-op Vital signs reviewed  Last Vitals:  Filed Vitals:   07/30/12 1215  BP: 114/59  Pulse: 97  Temp:   Resp: 12    Post vital signs: Reviewed  Level of consciousness: sedated  Complications: No apparent anesthesia complications

## 2012-07-31 ENCOUNTER — Encounter (HOSPITAL_COMMUNITY): Payer: Self-pay | Admitting: Specialist

## 2012-07-31 LAB — BASIC METABOLIC PANEL
Calcium: 8.6 mg/dL (ref 8.4–10.5)
Creatinine, Ser: 0.73 mg/dL (ref 0.50–1.10)
GFR calc Af Amer: >90 mL/min (ref >90)
GFR calc non Af Amer: 87 mL/min — ABNORMAL LOW (ref >90)
Sodium: 137 mEq/L (ref 135–145)

## 2012-07-31 LAB — CBC
MCV: 95.4 fL (ref 78.0–100.0)
Platelets: 299 10*3/uL (ref 150–400)
RBC: 3.46 MIL/uL — ABNORMAL LOW (ref 3.87–5.11)
RDW: 12.8 % (ref 11.5–15.5)
WBC: 17.7 10*3/uL — ABNORMAL HIGH (ref 4.0–10.5)

## 2012-07-31 NOTE — Op Note (Signed)
[name], [name] NO.:  [id number]  MEDICAL RECORD NO.:  [id number]  LOCATION:  1615                         FACILITY:  [facility]  PHYSICIAN:  Jene Every, M.D.    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  PROCEDURES PERFORMED: 1. Microlumbar decompression, L1-2, L2-3, L3-4, L4-5 bilaterally and     L5-S1 on the left. 2. Foraminotomies of L2, L3, L4, and L5 bilaterally. 3. Laminectomies of L2, L3, L4, and L5.  ANESTHESIA:  General.  ASSISTANT:  Georges Lynch. Darrelyn Hillock, M.D.  HISTORY:  This is a 67 year old female with neurogenic claudication secondary to spinal stenosis with severe left lower extremity radicular pain secondary to multilevel spinal stenosis on the left, also on the right, multilevel nerve root distribution.  She had an MRI followed by CT myelogram, which indicated multilevel significant waist deformities at L1-2, L2-3, L3-4, L4-5, and in the neural foramen at 5 particularly on the left.  She had predominantly left-sided symptoms.  We discussed operative intervention and decompression particular at L4-5 and L3-4 with waist deformities noted on the CT myelogram and consultation with Dr. Pollyann Kennedy felt decompression from L2 down to the L4-5 and to the left at L5-S1, would be required to ensure optimal relief of her symptoms.  She had a minimal scoliosis, degenerative changes were noted.  Had no spinal instability with flexion and extension on radiographs.  We discussed risks and benefits including bleeding, infection, damage to neurovascular structures, DVT, PE, anesthetic complications, need for revision and fusion in the future.  TECHNIQUE:  With  the patient in supine position, after induction of adequate general anesthesia, 2 g of Kefzol, placed prone on the Pauline frame.  All bony prominences were well padded.  Lumbar region was prepped and draped in usual sterile fashion.  Two 18-gauge spinal needles were utilized to localize the levels from 1-2 down to 5. Incision was made from the spinous process of just below 1 to the sacrum.  Subcutaneous tissue was dissected.  Electrocautery was utilized to achieve hemostasis.  A 0.25% Marcaine with epinephrine was infiltrated in the paraspinous musculature for pain control and hemostasis.  We identified the dorsolumbar fascia, divided in line with skin incision.  Paraspinous muscle was elevated from lamina of L1-2, L2- 3, L3-4 and L4-5 and the sacrum.  McCullough retractor was placed. Confirmatory radiograph was obtained.  Leksell rongeur was utilized to remove the spinous processes to 2, 3, 4 and 5.  We then used the operating microscope and draped and brought out into the surgical field. We first started at the caudad edge of 4 down the interlaminar space using 2-mm Kerrison and performed the central laminectomy of 4. Following this, we decompressed the lateral recesses to the medial border of the pedicle, ligamentum flavum hypertrophy was noted with severe compression of the thecal sac bilaterally.  We performed bilateral decompressions to the medial border of the pedicle for performing foraminotomies of 4  and 5 bilaterally.  Severely tied on the left, also on the right, but greater on the left.  We performed a hemilaminectomy of L5 on the left, followed by L5 foramen from beneath the lamina and down into the foramen and we performed a foraminotomy. There was compression out into the recess and this decompressed the foramen out to the left.  The predominance of this pathology was at L4-5 region just at the disk space and cephalad to that.  Following the central laminectomy of 4, we  then continued with the decompression and removing the central lamina of 4, decompressing the lateral recess to the medial border of the pedicle at L3-4 and performed foraminotomies of L3 bilaterally, particularly stenotic on the left with hypertrophic facet and ligamentum flavum hypertrophy.  Bipolar electrocautery was utilized to achieve hemostasis.  Particularly stenotic on the left compared to the right.  There was again significant stenosis cephalad and removed the central lamina of 3 and 2 in a similar fashion, was able to decompress 2-3 and 1-2, did not feel that removal of the spinous process on lamina 1 was indicated and in fact found the removal of her lamina of 2.  We had good epidural fat noted.  The foramen moderately stenotic, performed foraminotomies of 2 and 3 bilaterally, again left greater than right.  This were consistent with that seen on the myelogram with multilevel waist deformities, significant compression of the thecal sac in the upright and dynamic position.  There was significant adhesions to the thecal sac on the 4 and 5 root on the left, that were meticulously plane between the thecal sac and the ligamentum flavum and the facet were meticulously developed.  There was no evidence of a rent in the thecal sac and there was no CSF leakage.  We felt the disk at each level, there was no soft disk herniation, we felt was contributing to the compression.  The wound was copiously irrigated repetitively throughout.  Hockey-stick probes were placed in the foramen cephalad and then caudad, and this was out into the foramen well at L5- S1 and at L1-2 cephalad.  I therefore felt that there was satisfactory decompression of all the levels of compression noted on her MRI and on her CT myelogram.  The decompression was performed from the opposite side of the surgical table with Dr. Darrelyn Hillock as the assistant.  Next, inspection revealed no CSF leakage or active bleeding.  It was  irrigated with antibiotic irrigation and again inspection revealed a good restoration of the thecal sac.  We next placed bone wax on the cancellous surfaces, placed thrombin-soaked Gelfoam over the laminotomy defect, removed the McCullough retractor.  Paraspinous muscle was inspected, and there were no evidence of active bleeding and were copiously irrigated with antibiotic irrigation.  We closed the fascia with #1 Vicryl interrupted figure-of-8 sutures.  Small aperture in the cephalad portion of the wound approximately 1 cm of length for drainage if necessary.  Subcu was irrigated and closed with 2-0 Vicryl simple sutures.  Skin was reapproximated with staples.  Wound was dressed sterilely.  She was placed in supine on hospital bed, extubated without difficulty, transported to the recovery room in satisfactory condition.  The patient tolerated the procedure well.  There were no complications. Assistant, Ranee Gosselin, M.D.  Blood loss 260 mL.     Jene Every, M.D.     Cordelia Pen  D:  07/30/2012  T:  07/31/2012  Job:  [id number]

## 2012-07-31 NOTE — Progress Notes (Signed)
Subjective: 1 Day Post-Op Procedure(s) (LRB): Lumbar Decompression L1 - S1 (X-Ray)4 LEVEL (N/A) Patient reports pain as 4 on 0-10 scale.    Objective: Vital signs in last 24 hours: Temp:  [96.6 F (35.9 C)-98.1 F (36.7 C)] 98.1 F (36.7 C) (02/27 0455) Pulse Rate:  [87-113] 92 (02/27 0455) Resp:  [7-20] 16 (02/27 0455) BP: (92-150)/(46-79) 107/62 mmHg (02/27 0455) SpO2:  [94 %-100 %] 97 % (02/27 0455) Weight:  [65.318 kg (144 lb)] 65.318 kg (144 lb) (02/26 1245)  Intake/Output from previous day: 02/26 0701 - 02/27 0700 In: 3092.1 [P.O.:540; I.V.:2293.8; IV Piggyback:258.3] Out: 1735 [Urine:1475; Blood:260] Intake/Output this shift:     Recent Labs  07/31/12 0455  HGB 11.2*    Recent Labs  07/31/12 0455  WBC 17.7*  RBC 3.46*  HCT 33.0*  PLT 299    Recent Labs  07/31/12 0455  NA 137  K 4.3  CL 104  CO2 25  BUN 15  CREATININE 0.73  GLUCOSE 147*  CALCIUM 8.6   No results found for this basename: LABPT, INR,  in the last 72 hours  Neurologically intact Sensation intact distally Intact pulses distally Dorsiflexion/Plantar flexion intact Incision: dressing C/D/I Compartment soft  Assessment/Plan: 1 Day Post-Op Procedure(s) (LRB): Lumbar Decompression L1 - S1 (X-Ray)4 LEVEL (N/A) Advance diet D/C IV fluids Plan for discharge tomorrow  Cheray Pardi C 07/31/2012, 7:36 AM

## 2012-07-31 NOTE — Progress Notes (Signed)
CSW consulted for sNF placement. PN reviewed. Pt plans to return home following hospital d/c. RNCM will assist with d/c planning needs.  Cori Razor LCSW [phone]

## 2012-07-31 NOTE — Evaluation (Signed)
Physical Therapy Evaluation Patient Details Name: [name] MRN: [id number] DOB: 1945/09/02 Today's Date: 07/31/2012 Time: 1135-1205 PT Time Calculation (min): 30 min  PT Assessment / Plan / Recommendation Clinical Impression  Pt s/p lumbar decomp multilevel presents with functional mobility limited by back precautions and post op pain    PT Assessment  Patient needs continued PT services    Follow Up Recommendations  No PT follow up    Does the patient have the potential to tolerate intense rehabilitation      Barriers to Discharge None      Equipment Recommendations  Rolling walker with 5" wheels (pt is 5' and 1/2 in)    Recommendations for Other Services OT consult   Frequency 7X/week    Precautions / Restrictions Precautions Precautions: Back Precaution Booklet Issued: Yes (comment) Restrictions Weight Bearing Restrictions: No   Pertinent Vitals/Pain 5/10; premed, RN aware      Mobility  Bed Mobility Bed Mobility: Supine to Sit Supine to Sit: 4: Min assist Details for Bed Mobility Assistance: cues for sequence and correct log roll technique Transfers Transfers: Sit to Stand;Stand to Sit Sit to Stand: 4: Min assist Stand to Sit: 4: Min assist Details for Transfer Assistance: cues for LE position, use of UEs to self assist and avoidance of fwd flex Ambulation/Gait Ambulation/Gait Assistance: 4: Min assist Ambulation Distance (Feet): 100 Feet Assistive device: Rolling walker Ambulation/Gait Assistance Details: cues for posture and position from RW Gait Pattern: Step-through pattern;Decreased step length - right;Decreased step length - left General Gait Details: Noted slight buckling at R knee x 2 - pt states that happened before surg but primarily on L side    Exercises     PT Diagnosis: Difficulty walking  PT Problem List: Decreased strength;Decreased activity tolerance;Decreased mobility;Decreased knowledge of use of DME;Pain;Decreased knowledge  of precautions PT Treatment Interventions: DME instruction;Gait training;Stair training;Functional mobility training;Therapeutic activities;Patient/family education   PT Goals Acute Rehab PT Goals PT Goal Formulation: With patient Time For Goal Achievement: 08/04/12 Potential to Achieve Goals: Good Pt will go Supine/Side to Sit: with supervision PT Goal: Supine/Side to Sit - Progress: Goal set today Pt will go Sit to Supine/Side: with supervision PT Goal: Sit to Supine/Side - Progress: Goal set today Pt will go Sit to Stand: with supervision PT Goal: Sit to Stand - Progress: Goal set today Pt will go Stand to Sit: with supervision PT Goal: Stand to Sit - Progress: Goal set today Pt will Ambulate: >150 feet;with supervision;with rolling walker PT Goal: Ambulate - Progress: Goal set today Pt will Go Up / Down Stairs: 3-5 stairs;with min assist;with least restrictive assistive device PT Goal: Up/Down Stairs - Progress: Goal set today  Visit Information  Last PT Received On: 07/31/12 Assistance Needed: +1    Subjective Data  Subjective: I.m ready to move Patient Stated Goal: Resume previous lifestyle with decreased pain   Prior Functioning  Home Living Lives With: Spouse Available Help at Discharge: Family Type of Home: House Home Access: Stairs to enter Secretary/administrator of Steps: 4 Entrance Stairs-Rails: Right Home Layout: One level Home Adaptive Equipment: None Prior Function Level of Independence: Independent Able to Take Stairs?: Yes Driving: Yes Communication Communication: No difficulties Dominant Hand: Right    Cognition  Cognition Overall Cognitive Status: Appears within functional limits for tasks assessed/performed Arousal/Alertness: Awake/alert Orientation Level: Appears intact for tasks assessed Behavior During Session: [facility] for tasks performed    Extremity/Trunk Assessment Right Upper Extremity Assessment RUE ROM/Strength/Tone: [facility] for tasks  assessed Left Upper Extremity Assessment LUE ROM/Strength/Tone: [facility] for tasks assessed Right Lower Extremity Assessment RLE ROM/Strength/Tone: [facility] for tasks assessed Left Lower Extremity Assessment LLE ROM/Strength/Tone: [facility] for tasks assessed   Balance    End of Session PT - End of Session Activity Tolerance: Patient tolerated treatment well Patient left: in chair;with call bell/phone within reach;with family/visitor present Nurse Communication: Mobility status  GP     Ruhi Kopke 07/31/2012, 1:27 PM

## 2012-07-31 NOTE — Evaluation (Signed)
Occupational Therapy Evaluation Patient Details Name: [name] MRN: [id number] DOB: 28-Dec-1945 Today's Date: 07/31/2012 Time: 5284-1324 OT Time Calculation (min): 30 min  OT Assessment / Plan / Recommendation Clinical Impression  This 67 year old female was admitted for L1 to sacrum decompression/lami.  Will follow pt in acute to reinforce education and ensure safety with adls following back precautions.      OT Assessment  Patient needs continued OT Services    Follow Up Recommendations  No OT follow up    Barriers to Discharge      Equipment Recommendations  3 in 1 bedside comode    Recommendations for Other Services    Frequency  Min 2X/week    Precautions / Restrictions Precautions Precautions: Back Precaution Booklet Issued: Yes (comment) Restrictions Weight Bearing Restrictions: No   Pertinent Vitals/Pain Back 5/10 premedicated and repositioned    ADL  Grooming: Set up Where Assessed - Grooming: Supported sitting Upper Body Bathing: Set up Where Assessed - Upper Body Bathing: Supported sitting Upper Body Dressing: Minimal assistance (iv) Where Assessed - Upper Body Dressing: Supported sitting Toilet Transfer: Minimal assistance Toilet Transfer Method: Sit to Barista: Raised toilet seat with arms (or 3-in-1 over toilet) Equipment Used: Rolling walker Transfers/Ambulation Related to ADLs: ambulated to bathroom:  adjusted height of 3:1 3xs for comfort.  Pt not ready to step over tub, but educated on how to do this safely ADL Comments: Educated pt on back precautions and adls.  Showed alternatives for LB dressing.  Pt has a Sports administrator at home.  ADL was completed and pt getting ready to have catheter out.  Will return tomorrow am for LB adls, to check for safety.  Husband is going to get toilet aide.    OT Diagnosis: Generalized weakness  OT Problem List: Decreased strength;Decreased activity tolerance;Decreased knowledge of use of DME or  AE;Decreased knowledge of precautions;Pain OT Treatment Interventions: Self-care/ADL training;DME and/or AE instruction;Patient/family education   OT Goals Acute Rehab OT Goals OT Goal Formulation: With patient/family Time For Goal Achievement: 08/07/12 Potential to Achieve Goals: Good ADL Goals Pt Will Perform Lower Body Bathing: with supervision;Sit to stand from chair;with adaptive equipment ADL Goal: Lower Body Bathing - Progress: Goal set today Pt Will Perform Lower Body Dressing: with supervision;Sit to stand from chair;with adaptive equipment ADL Goal: Lower Body Dressing - Progress: Goal set today Pt Will Transfer to Toilet: with supervision;Ambulation;3-in-1 ADL Goal: Toilet Transfer - Progress: Goal set today Pt Will Perform Toileting - Hygiene: with supervision;Sit to stand from 3-in-1/toilet;with adaptive equipment ADL Goal: Toileting - Hygiene - Progress: Goal set today  Visit Information  Last OT Received On: 07/31/12 Assistance Needed: +1    Subjective Data  Subjective: I'm concerned about wiping Patient Stated Goal: none stated.  Will spend time alone after this weekend   Prior Functioning     Home Living Lives With: Spouse Available Help at Discharge: Family Type of Home: House Home Access: Stairs to enter Secretary/administrator of Steps: 4 Entrance Stairs-Rails: Right Home Layout: One level Home Adaptive Equipment: None Prior Function Level of Independence: Independent Able to Take Stairs?: Yes Driving: Yes Communication Communication: No difficulties Dominant Hand: Right         Vision/Perception     Cognition  Cognition Overall Cognitive Status: Appears within functional limits for tasks assessed/performed Arousal/Alertness: Awake/alert Orientation Level: Appears intact for tasks assessed Behavior During Session: [facility] for tasks performed    Extremity/Trunk Assessment Right Upper Extremity Assessment RUE ROM/Strength/Tone:  WFL for tasks  assessed Left Upper Extremity Assessment LUE ROM/Strength/Tone: [facility] for tasks assessed Right Lower Extremity Assessment RLE ROM/Strength/Tone: [facility] for tasks assessed Left Lower Extremity Assessment LLE ROM/Strength/Tone: [facility] for tasks assessed     Mobility Bed Mobility Bed Mobility: Supine to Sit Supine to Sit: 4: Min assist Details for Bed Mobility Assistance: cues for sequence and correct log roll technique Transfers Sit to Stand: 4: Min assist;From chair/3-in-1;With armrests Stand to Sit: 4: Min assist Details for Transfer Assistance: cues for hand placement     Exercise     Balance     End of Session OT - End of Session Activity Tolerance: Patient tolerated treatment well Patient left: in chair;with call bell/phone within reach;with family/visitor present  GO     Vernon Ariel 07/31/2012, 2:51 PM Marica Otter, OTR/L [phone] 07/31/2012

## 2012-07-31 NOTE — Care Management Note (Signed)
    Page 1 of 2   08/01/2012     2:58:53 PM   CARE MANAGEMENT NOTE 08/01/2012  Patient:  [name], [name]   Account Number:  [id number]  Date Initiated:  07/31/2012  Documentation initiated by:  Colleen Can  Subjective/Objective Assessment:   dx lumbar stenosis l1-s1; micro lumbar decompression, foraminotomies, laminectomies     Action/Plan:   CM spoke with patient and spouse. Plans are for patient to return to her home in [place] where spouse will be caregiver. She will need DME-RW and commode seat   Anticipated DC Date:  08/01/2012   Anticipated DC Plan:  HOME/SELF CARE  In-house referral  NA      DC Planning Services  CM consult      PAC Choice  DURABLE MEDICAL EQUIPMENT   Choice offered to / List presented to:     DME arranged  3-N-1  Levan Hurst      DME agency  [facility]        Status of service:  Completed, signed off Medicare Important Message given?   (If response is "NO", the following Medicare IM given date fields will be blank) Date Medicare IM given:   Date Additional Medicare IM given:    Discharge Disposition:  HOME/SELF CARE  Per UR Regulation:  Reviewed for med. necessity/level of care/duration of stay  If discussed at Long Length of Stay Meetings, dates discussed:    Comments:  08/01/2012 Colleen Can BSN RN CCM [phone] dme RW and 3n1 has been delivered to patient's room prior to discharge. There are no hh service needs.  07/31/2012 Colleen Can BSN RN CCM [phone] [facility] notified of DME needs

## 2012-07-31 NOTE — Progress Notes (Signed)
Physical Therapy Treatment Patient Details Name: [name] MRN: [id number] DOB: 1945/06/13 Today's Date: 07/31/2012 Time: 9629-5284 PT Time Calculation (min): 23 min  PT Assessment / Plan / Recommendation Comments on Treatment Session       Follow Up Recommendations  No PT follow up     Does the patient have the potential to tolerate intense rehabilitation     Barriers to Discharge        Equipment Recommendations  Rolling walker with 5" wheels    Recommendations for Other Services OT consult  Frequency 7X/week   Plan Discharge plan remains appropriate    Precautions / Restrictions Precautions Precautions: Back Precaution Booklet Issued: Yes (comment) Precaution Comments: pt recalls 3/4 back precautions Restrictions Weight Bearing Restrictions: No   Pertinent Vitals/Pain 5/10; premed    Mobility  Transfers Transfers: Sit to Stand;Stand to Sit Sit to Stand: 4: Min guard;With armrests;From chair/3-in-1 Stand to Sit: 4: Min guard;With armrests;To chair/3-in-1 Details for Transfer Assistance: cues for hand placement Ambulation/Gait Ambulation/Gait Assistance: 4: Min guard Ambulation Distance (Feet): 294 Feet Assistive device: Rolling walker Ambulation/Gait Assistance Details: min cues for posture and position from RW Gait Pattern: Step-through pattern;Decreased step length - right;Decreased step length - left General Gait Details: No buckling at knee this pm Stairs: Yes Stair Management Technique: One rail Left;Step to pattern Number of Stairs: 2 (twice)    Exercises     PT Diagnosis:    PT Problem List:   PT Treatment Interventions:     PT Goals Acute Rehab PT Goals PT Goal Formulation: With patient Time For Goal Achievement: 08/04/12 Potential to Achieve Goals: Good Pt will go Supine/Side to Sit: with supervision PT Goal: Supine/Side to Sit - Progress: Goal set today Pt will go Sit to Supine/Side: with supervision PT Goal: Sit to Supine/Side -  Progress: Goal set today Pt will go Sit to Stand: with supervision PT Goal: Sit to Stand - Progress: Progressing toward goal Pt will go Stand to Sit: with supervision PT Goal: Stand to Sit - Progress: Progressing toward goal Pt will Ambulate: >150 feet;with supervision;with rolling walker PT Goal: Ambulate - Progress: Progressing toward goal Pt will Go Up / Down Stairs: 3-5 stairs;with min assist;with least restrictive assistive device PT Goal: Up/Down Stairs - Progress: Progressing toward goal  Visit Information  Last PT Received On: 07/31/12 Assistance Needed: +1    Subjective Data  Subjective: I want to stay in the chair, its so much more comfortable than the bed  Patient Stated Goal: Resume previous lifestyle with decreased pain   Cognition  Cognition Overall Cognitive Status: Appears within functional limits for tasks assessed/performed Arousal/Alertness: Awake/alert Orientation Level: Appears intact for tasks assessed Behavior During Session: [facility] for tasks performed    Balance     End of Session PT - End of Session Activity Tolerance: Patient tolerated treatment well Patient left: in chair;with call bell/phone within reach;with family/visitor present Nurse Communication: Mobility status   GP     Genifer Lazenby 07/31/2012, 3:38 PM

## 2012-08-01 LAB — CBC
HCT: 31.2 % — ABNORMAL LOW (ref 36.0–46.0)
Hemoglobin: 10 g/dL — ABNORMAL LOW (ref 12.0–15.0)
MCHC: 32.1 g/dL (ref 30.0–36.0)
RDW: 12.6 % (ref 11.5–15.5)
WBC: 12.4 10*3/uL — ABNORMAL HIGH (ref 4.0–10.5)

## 2012-08-01 MED ORDER — ASPIRIN EC 81 MG PO TBEC: 81.0000 mg | DELAYED_RELEASE_TABLET | Freq: Two times a day (BID) | ORAL | Status: DC

## 2012-08-01 NOTE — Progress Notes (Signed)
Physical Therapy Treatment Patient Details Name: [name] MRN: [id number] DOB: [birth year] Today's Date: 08/01/2012 Time: 0931-0950 PT Time Calculation (min): 19 min  PT Assessment / Plan / Recommendation Comments on Treatment Session  Reviewed back precautions and car transfers    Follow Up Recommendations  No PT follow up     Does the patient have the potential to tolerate intense rehabilitation     Barriers to Discharge        Equipment Recommendations  Rolling walker with 5" wheels    Recommendations for Other Services OT consult  Frequency 7X/week   Plan Discharge plan remains appropriate    Precautions / Restrictions Precautions Precautions: Back Precaution Booklet Issued: Yes (comment) Precaution Comments: recalls all back precautions Restrictions Weight Bearing Restrictions: No   Pertinent Vitals/Pain 6/10; premed, MR requested and received    Mobility  Bed Mobility Bed Mobility:  (Pt states comfortable with ability after practice with OT) Transfers Transfers: Sit to Stand;Stand to Sit Sit to Stand: 5: Supervision Stand to Sit: 5: Supervision Details for Transfer Assistance: cues for hand placement Ambulation/Gait Ambulation/Gait Assistance: 4: Min guard;5: Supervision Ambulation Distance (Feet): 270 Feet Assistive device: Rolling walker Ambulation/Gait Assistance Details: min cues for position from RW Gait Pattern: Step-through pattern Stairs: Yes Stairs Assistance: 4: Min assist Stairs Assistance Details (indicate cue type and reason): min cues for sequence Stair Management Technique: One rail Left;Step to pattern Number of Stairs: 2 (also one step bkwd with stool  to get into elevated bed)    Exercises     PT Diagnosis:    PT Problem List:   PT Treatment Interventions:     PT Goals Acute Rehab PT Goals PT Goal Formulation: With patient Time For Goal Achievement: 08/04/12 Potential to Achieve Goals: Good Pt will go Supine/Side to  Sit: with supervision PT Goal: Supine/Side to Sit - Progress: Met Pt will go Sit to Supine/Side: with supervision PT Goal: Sit to Supine/Side - Progress: Met Pt will go Sit to Stand: with supervision PT Goal: Sit to Stand - Progress: Met Pt will go Stand to Sit: with supervision PT Goal: Stand to Sit - Progress: Met Pt will Ambulate: >150 feet;with supervision;with rolling walker PT Goal: Ambulate - Progress: Met Pt will Go Up / Down Stairs: 3-5 stairs;with min assist;with least restrictive assistive device PT Goal: Up/Down Stairs - Progress: Met  Visit Information  Last PT Received On: 08/01/12 Assistance Needed: +1    Subjective Data  Subjective: I'm ready to go home Patient Stated Goal: Resume previous lifestyle with decreased pain   Cognition  Cognition Overall Cognitive Status: Appears within functional limits for tasks assessed/performed Arousal/Alertness: Awake/alert Orientation Level: Appears intact for tasks assessed Behavior During Session: [facility] for tasks performed    Balance     End of Session PT - End of Session Activity Tolerance: Patient tolerated treatment well Patient left: in chair;with call bell/phone within reach;with family/visitor present Nurse Communication: Mobility status   GP     [name] 08/01/2012, 10:38 AM

## 2012-08-01 NOTE — Progress Notes (Signed)
Subjective: 2 Days Post-Op Procedure(s) (LRB): Lumbar Decompression L1 - S1 (X-Ray)4 LEVEL (N/A) Patient reports pain as mild.  Notes incisional back pain. Denies leg pain. Unchanged R foot numbness. Nausea resolved. Voiding without issues. Feeling ready for D/C home.  Objective: Vital signs in last 24 hours: Temp:  [97.7 F (36.5 C)-98.8 F (37.1 C)] 98.5 F (36.9 C) (02/28 0620) Pulse Rate:  [96-103] 102 (02/28 0620) Resp:  [14-19] 14 (02/28 0620) BP: (93-115)/(60-74) 93/60 mmHg (02/28 0620) SpO2:  [94 %-95 %] 95 % (02/28 0620)  Intake/Output from previous day: 02/27 0701 - 02/28 0700 In: 1772.5 [P.O.:960; I.V.:812.5] Out: 1601 [Urine:1601] Intake/Output this shift:     Recent Labs  07/31/12 0455 08/01/12 0504  HGB 11.2* 10.0*    Recent Labs  07/31/12 0455 08/01/12 0504  WBC 17.7* 12.4*  RBC 3.46* 3.22*  HCT 33.0* 31.2*  PLT 299 304    Recent Labs  07/31/12 0455  NA 137  K 4.3  CL 104  CO2 25  BUN 15  CREATININE 0.73  GLUCOSE 147*  CALCIUM 8.6   No results found for this basename: LABPT, INR,  in the last 72 hours  Neurologically intact ABD soft Neurovascular intact Sensation intact distally Intact pulses distally Dorsiflexion/Plantar flexion intact Incision: dressing C/D/I No cellulitis present Compartment soft No calf pain or sign of DVT  Assessment/Plan: 2 Days Post-Op Procedure(s) (LRB): Lumbar Decompression L1 - S1 (X-Ray)4 LEVEL (N/A) Advance diet Up with therapy D/C IV fluids D/C home today Reviewed D/C instructions Discussed with Dr. Shelle Iron Follow up 10-14 days with Dr. Elissa Lovett, Dayna Barker. 08/01/2012, 8:18 AM

## 2012-08-01 NOTE — Progress Notes (Signed)
Occupational Therapy Treatment Patient Details Name: [name] MRN: [id number] DOB: [birth year] Today's Date: 08/01/2012 Time: 4098-1191 OT Time Calculation (min): 17 min  OT Assessment / Plan / Recommendation Comments on Treatment Session Pt progressing well.  Does not need any further OT    Follow Up Recommendations  No OT follow up    Barriers to Discharge       Equipment Recommendations  3 in 1 bedside comode    Recommendations for Other Services    Frequency     Plan      Precautions / Restrictions Precautions Precautions: Back Precaution Comments: recalls all back precautions Restrictions Weight Bearing Restrictions: No   Pertinent Vitals/Pain Pain is "Ok"  repositioned    ADL  Lower Body Dressing: Performed;Supervision/safety Where Assessed - Lower Body Dressing: Supported sit to stand (with reacher) Toilet Transfer: Simulated;Supervision/safety (spt: chair-bed-chair) Transfers/Ambulation Related to ADLs: practiced bed mobiility:  min A for sit to R sidelying, supervision to roll, and for right sidelying to sit ADL Comments: pt verbalizes bathing with reacher.  Husband bought her toilet aide    OT Diagnosis:    OT Problem List:   OT Treatment Interventions:     OT Goals ADL Goals Pt Will Perform Lower Body Bathing: with supervision;Sit to stand from chair;with adaptive equipment ADL Goal: Lower Body Bathing - Progress:  (verbalizes) Pt Will Perform Lower Body Dressing: with supervision;Sit to stand from chair;with adaptive equipment ADL Goal: Lower Body Dressing - Progress: Met Pt Will Transfer to Toilet: with supervision;Ambulation;3-in-1 ADL Goal: Toilet Transfer - Progress:  (verbalizes) Pt Will Perform Toileting - Hygiene: with supervision;Sit to stand from 3-in-1/toilet;with adaptive equipment ADL Goal: Toileting - Hygiene - Progress:  (verbalizes)  Visit Information  Last OT Received On: 08/01/12 Assistance Needed: +1    Subjective Data       Prior Functioning       Cognition  Cognition Overall Cognitive Status: Appears within functional limits for tasks assessed/performed Behavior During Session: [facility] for tasks performed    Mobility  Transfers Sit to Stand: 5: Supervision;With armrests;From chair/3-in-1;From bed;With upper extremity assist Details for Transfer Assistance: cues for hand placement    Exercises      Balance     End of Session OT - End of Session Activity Tolerance: Patient tolerated treatment well Patient left: in chair;with call bell/phone within reach;with family/visitor present  GO     Amire Gossen 08/01/2012, 8:38 AM Marica Otter, OTR/L [phone] 08/01/2012

## 2012-08-02 NOTE — Discharge Summary (Signed)
Physician Discharge Summary   Patient ID: [name] MRN: [id number] DOB/AGE: January 26, 1946 67 y.o.  Admit date: 07/30/2012 Discharge date: 08/02/2012  Primary Diagnosis:   Stenosis L1 - S1  Admission Diagnoses:  Past Medical History  Diagnosis Date  . Spinal stenosis   . Hypertension   . Hyperlipidemia   . GERD (gastroesophageal reflux disease)   . Arthritis   . Complication of anesthesia     "slow to wake up"   Discharge Diagnoses:   Principal Problem:   Spinal stenosis, lumbar region, with neurogenic claudication  Procedure:  Procedure(s) (LRB): Lumbar Decompression L1 - S1 (X-Ray)4 LEVEL (N/A)   Consults: None  HPI:  see H&P    Laboratory Data: Hospital Outpatient Visit on 07/22/2012  Component Date Value Range Status  . Sodium 07/22/2012 140  135 - 145 mEq/L Final  . Potassium 07/22/2012 4.9  3.5 - 5.1 mEq/L Final  . Chloride 07/22/2012 103  96 - 112 mEq/L Final  . CO2 07/22/2012 27  19 - 32 mEq/L Final  . Glucose, Bld 07/22/2012 94  70 - 99 mg/dL Final  . BUN 40/98/1191 16  6 - 23 mg/dL Final  . Creatinine, Ser 07/22/2012 0.86  0.50 - 1.10 mg/dL Final  . Calcium 47/82/9562 10.3  8.4 - 10.5 mg/dL Final  . GFR calc non Af Amer 07/22/2012 69* >90 mL/min Final  . GFR calc Af Amer 07/22/2012 80* >90 mL/min Final   Comment:                                 The eGFR has been calculated                          using the CKD EPI equation.                          This calculation has not been                          validated in all clinical                          situations.                          eGFR's persistently                          <90 mL/min signify                          possible Chronic Kidney Disease.  . WBC 07/22/2012 10.8* 4.0 - 10.5 K/uL Final  . RBC 07/22/2012 4.63  3.87 - 5.11 MIL/uL Final  . Hemoglobin 07/22/2012 14.6  12.0 - 15.0 g/dL Final  . HCT 13/01/6577 44.2  36.0 - 46.0 % Final  . MCV 07/22/2012 95.5  78.0 - 100.0 fL  Final  . MCH 07/22/2012 31.5  26.0 - 34.0 pg Final  . MCHC 07/22/2012 33.0  30.0 - 36.0 g/dL Final  . RDW 46/96/2952 12.3  11.5 - 15.5 % Final  . Platelets 07/22/2012 364  150 - 400 K/uL Final  . MRSA, PCR 07/22/2012 NEGATIVE  NEGATIVE Final  . Staphylococcus aureus 07/22/2012 NEGATIVE  NEGATIVE Final   Comment:                                 The Xpert SA Assay (FDA                          approved for NASAL specimens                          in patients over 22 years of age),                          is one component of                          a comprehensive surveillance                          program.  Test performance has                          been validated by [employer] for patients greater                          than or equal to 53 year old.                          It is not intended                          to diagnose infection nor to                          guide or monitor treatment.    Recent Labs  07/31/12 0455 08/01/12 0504  HGB 11.2* 10.0*    Recent Labs  07/31/12 0455 08/01/12 0504  WBC 17.7* 12.4*  RBC 3.46* 3.22*  HCT 33.0* 31.2*  PLT 299 304    Recent Labs  07/31/12 0455  NA 137  K 4.3  CL 104  CO2 25  BUN 15  CREATININE 0.73  GLUCOSE 147*  CALCIUM 8.6   No results found for this basename: LABPT, INR,  in the last 72 hours  X-Rays:Dg Lumbar Spine 2-3 Views  07/22/2012  *RADIOLOGY REPORT*  Clinical Data: Preop microdiskectomy  LUMBAR SPINE - 2-3 VIEW  Comparison: C T 06/16/2012  Findings: Diffuse degenerative disc and facet disease throughout the lumbar spine.  Normal alignment.  No fracture.  SI joints are symmetric and unremarkable.  IMPRESSION: Severe diffuse degenerative disc and facet disease.   Original Report Authenticated By: Charlett Nose, M.D.    Dg Spine Portable 1 View  07/30/2012  *RADIOLOGY REPORT*  Clinical Data: Spine surgery.  Intraoperative film.  PORTABLE SPINE - 1 VIEW  Comparison:  Intraoperative film 07/30/2012 at 9:23 a.m.  Findings: Localization demonstrates a probe localizing L2-3.  Tips of inferior probe are at the level of the L5-S1 foramina.  IMPRESSION: Localization as above.   Original Report Authenticated By: Holley Dexter, M.D.  Dg Spine Portable 1 View  07/30/2012  *RADIOLOGY REPORT*  Clinical Data: Intraoperative film.  Lumbar surgery.  PORTABLE SPINE - 1 VIEW  Comparison: Intraoperative view of the lumbar spine earlier this same date.  Findings: Two probes are identified.  The tip of the more superior probe is at the level of the L2 pedicles.  Inferior probe projects over the L4-5 foramina.  Multilevel degenerative change is noted.  IMPRESSION: Localization as above.   Original Report Authenticated By: Holley Dexter, M.D.    Dg Spine Portable 1 View  07/30/2012  *RADIOLOGY REPORT*  Clinical Data: Intraoperative labeling.  PORTABLE SPINE - 1 VIEW  Comparison: 07/30/2012.  Findings: Number system utilized on examination of 0802 hours the same day is preserved.  Surgical instrument tips project posterior to the L2 vertebral body and over the L4 spinous process. Retractors are in place.  Multilevel endplate degenerative changes and facet hypertrophy are seen.  IMPRESSION: Intraoperative localization, as above.   Original Report Authenticated By: Leanna Battles, M.D.    Dg Spine Portable 1 View  07/30/2012  *RADIOLOGY REPORT*  Clinical Data: Intraoperative film.  Lumbar surgery.  PORTABLE SPINE - 1 VIEW  Comparison: Two views lumbar spine 07/22/2012.  Findings: Single intraoperative view of the lumbar spine in the lateral projection is provided.  Two probes are seen.  The more superior probe is at the level of L1-2 with more inferior is at the level of L5-S1.  Multilevel marked degenerative disc disease and facet arthropathy are identified.  IMPRESSION: Probes directed at L1-2 and L5-S1.   Original Report Authenticated By: Holley Dexter, M.D.     EKG:No orders  found for this or any previous visit.   Hospital Course: Patient was admitted to [facility] and taken to the OR and underwent the above state procedure without complications.  Patient tolerated the procedure well and was later transferred to the recovery room and then to the orthopaedic floor for postoperative care.  They were given PO and IV analgesics for pain control following their surgery.  They were given 24 hours of postoperative antibiotics.   PT was consulted postop to assist with mobility and transfers.  The patient was allowed to be WBAT with therapy and was taught back precautions. Discharge planning was consulted to help with postop disposition and equipment needs.  Patient had a fair night on the evening of surgery, she did experience some nausea, and started to get up OOB with therapy on day one. Nausea resolved. Patient was seen in rounds and was ready to go home on day two.  They were given discharge instructions and dressing directions.  They were instructed on when to follow up in the office with Dr. Shelle Iron.  Discharge Medications: Prior to Admission medications   Medication Sig Start Date End Date Taking? Authorizing Provider  bisacodyl (BISACODYL) 5 MG EC tablet Take 5 mg by mouth 3 (three) times a week. For constipation   Yes Historical Provider, MD  Calcium Carbonate-Vitamin D (CALCIUM 600 + D PO) Take 1 tablet by mouth 2 (two) times daily with a meal.   Yes Historical Provider, MD  dimenhyDRINATE (DRAMAMINE) 50 MG tablet Take 50 mg by mouth at bedtime as needed (fo nausea).   Yes Historical Provider, MD  lisinopril (PRINIVIL,ZESTRIL) 5 MG tablet Take 5 mg by mouth daily after breakfast.   Yes Historical Provider, MD  Multiple Vitamin (MULTIVITAMIN WITH MINERALS) TABS Take 0.5 tablets by mouth 2 (two) times daily with a meal. States will  stop 07/23/12   Yes Historical Provider, MD  nortriptyline (PAMELOR) 25 MG capsule Take 25 mg by mouth at bedtime.   Yes Historical  Provider, MD  Omega-3 Fatty Acids (FISH OIL) 1200 MG CAPS Take 1,200 mg by mouth 2 (two) times daily with a meal.   Yes Historical Provider, MD  piroxicam (FELDENE) 20 MG capsule Take 20 mg by mouth daily after supper. States last dose 07/23/12   Yes Historical Provider, MD  pravastatin (PRAVACHOL) 40 MG tablet Take 80 mg by mouth daily after supper.   Yes Historical Provider, MD  zolpidem (AMBIEN) 10 MG tablet Take 10 mg by mouth at bedtime.   Yes Historical Provider, MD  aspirin EC 81 MG tablet Take 1 tablet (81 mg total) by mouth 2 (two) times daily with a meal. States last dose will be 07/23/12 08/01/12   Dayna Barker. Thamar Holik, PA-C  HYDROcodone-acetaminophen (NORCO/VICODIN) 5-325 MG per tablet Take 1-2 tablets by mouth every 4 (four) hours as needed for pain. 07/30/12   Javier Docker, MD  ondansetron (ZOFRAN) 4 MG tablet Take 1 tablet (4 mg total) by mouth every 8 (eight) hours as needed for nausea. 07/30/12   Javier Docker, MD    Diet: low sodium heart healthy Activity:WBAT Follow-up:in 10-14 days Disposition - Home Discharged Condition: good   Discharge Orders   Future Orders Complete By Expires     Call MD / Call 911  As directed     Comments:      If you experience chest pain or shortness of breath, CALL 911 and be transported to the hospital emergency room.  If you develope a fever above 101 F, pus (white drainage) or increased drainage or redness at the wound, or calf pain, call your surgeon's office.    Constipation Prevention  As directed     Comments:      Drink plenty of fluids.  Prune juice may be helpful.  You may use a stool softener, such as Colace (over the counter) 100 mg twice a day.  Use MiraLax (over the counter) for constipation as needed.    Diet - low sodium heart healthy  As directed     Increase activity slowly as tolerated  As directed         Medication List    TAKE these medications       aspirin EC 81 MG tablet  Take 1 tablet (81 mg total) by mouth 2  (two) times daily with a meal. States last dose will be 07/23/12     bisacodyl 5 MG EC tablet  Generic drug:  bisacodyl  Take 5 mg by mouth 3 (three) times a week. For constipation     CALCIUM 600 + D PO  Take 1 tablet by mouth 2 (two) times daily with a meal.     dimenhyDRINATE 50 MG tablet  Commonly known as:  DRAMAMINE  Take 50 mg by mouth at bedtime as needed (fo nausea).     Fish Oil 1200 MG Caps  Take 1,200 mg by mouth 2 (two) times daily with a meal.     HYDROcodone-acetaminophen 5-325 MG per tablet  Commonly known as:  NORCO/VICODIN  Take 1-2 tablets by mouth every 4 (four) hours as needed for pain.     lisinopril 5 MG tablet  Commonly known as:  PRINIVIL,ZESTRIL  Take 5 mg by mouth daily after breakfast.     multivitamin with minerals Tabs  Take 0.5 tablets by mouth 2 (two)  times daily with a meal. States will stop 07/23/12     nortriptyline 25 MG capsule  Commonly known as:  PAMELOR  Take 25 mg by mouth at bedtime.     ondansetron 4 MG tablet  Commonly known as:  ZOFRAN  Take 1 tablet (4 mg total) by mouth every 8 (eight) hours as needed for nausea.     piroxicam 20 MG capsule  Commonly known as:  FELDENE  Take 20 mg by mouth daily after supper. States last dose 07/23/12     pravastatin 40 MG tablet  Commonly known as:  PRAVACHOL  Take 80 mg by mouth daily after supper.     zolpidem 10 MG tablet  Commonly known as:  AMBIEN  Take 10 mg by mouth at bedtime.           Follow-up Information   Follow up with BEANE,JEFFREY C, MD In 2 weeks.   Contact information:   [address] [place] [address] [phone]       Signed: Dorothy Spark. 08/02/2012, 8:39 AM

## 2013-10-01 ENCOUNTER — Other Ambulatory Visit: Payer: Self-pay | Admitting: Neurological Surgery

## 2013-10-01 DIAGNOSIS — M549 Dorsalgia, unspecified: Secondary | ICD-10-CM

## 2013-10-07 ENCOUNTER — Ambulatory Visit
Admission: RE | Admit: 2013-10-07 | Discharge: 2013-10-07 | Disposition: A | Discharge status: Home / Self Care | Payer: BC Managed Care – PPO | Source: Ambulatory Visit | Attending: Neurological Surgery | Admitting: Neurological Surgery

## 2013-10-07 VITALS — BP 111/62 | HR 68

## 2013-10-07 DIAGNOSIS — M549 Dorsalgia, unspecified: Secondary | ICD-10-CM

## 2013-10-07 DIAGNOSIS — M48062 Spinal stenosis, lumbar region with neurogenic claudication: Secondary | ICD-10-CM

## 2013-10-07 MED ORDER — MEPERIDINE HCL 100 MG/ML IJ SOLN
75.0000 mg | Freq: Once | INTRAMUSCULAR | Status: AC
  Administered 2013-10-07: 75 mg via INTRAMUSCULAR

## 2013-10-07 MED ORDER — DIAZEPAM 5 MG PO TABS
5.0000 mg | ORAL_TABLET | Freq: Once | ORAL | Status: AC
  Administered 2013-10-07: 5 mg via ORAL

## 2013-10-07 MED ORDER — ONDANSETRON HCL 4 MG/2ML IJ SOLN
4.0000 mg | Freq: Once | INTRAMUSCULAR | Status: AC
  Administered 2013-10-07: 4 mg via INTRAMUSCULAR

## 2013-10-07 MED ORDER — IOHEXOL 180 MG/ML  SOLN
15.0000 mL | Freq: Once | INTRAMUSCULAR | Status: AC | PRN
  Administered 2013-10-07: 15 mL via INTRATHECAL

## 2013-10-07 NOTE — Discharge Instructions (Signed)

## 2013-10-09 DIAGNOSIS — M47817 Spondylosis without myelopathy or radiculopathy, lumbosacral region: Secondary | ICD-10-CM | POA: Insufficient documentation

## 2013-10-29 ENCOUNTER — Other Ambulatory Visit: Payer: Self-pay | Admitting: Neurological Surgery

## 2013-11-04 ENCOUNTER — Encounter (HOSPITAL_COMMUNITY): Payer: Self-pay | Admitting: Pharmacy Technician

## 2013-11-05 NOTE — Pre-Procedure Instructions (Signed)
YONA KOSEK  11/05/2013   Your procedure is scheduled on:  11/13/13  Report to [facility] Admitting at 745 AM.  Call this number if you have problems the morning of surgery: [phone]   Remember:   Do not eat food or drink liquids after midnight.   Take these medicines the morning of surgery with A SIP OF WATER: none   Do not wear jewelry, make-up or nail polish.  Do not wear lotions, powders, or perfumes. You may wear deodorant.  Do not shave 48 hours prior to surgery. Men may shave face and neck.  Do not bring valuables to the hospital.  [facility] is not responsible                  for any belongings or valuables.               Contacts, dentures or bridgework may not be worn into surgery.  Leave suitcase in the car. After surgery it may be brought to your room.  For patients admitted to the hospital, discharge time is determined by your                treatment team.               Patients discharged the day of surgery will not be allowed to drive  home.  Name and phone number of your driver: family  Special Instructions: Shower using CHG 2 nights before surgery and the night before surgery.  If you shower the day of surgery use CHG.  Use special wash - you have one bottle of CHG for all showers.  You should use approximately 1/3 of the bottle for each shower.   Please read over the following fact sheets that you were given: Pain Booklet, Coughing and Deep Breathing, Blood Transfusion Information, MRSA Information and Surgical Site Infection Prevention

## 2013-11-06 ENCOUNTER — Encounter (HOSPITAL_COMMUNITY)
Admission: RE | Admit: 2013-11-06 | Discharge: 2013-11-06 | Disposition: A | Discharge status: Home / Self Care | Payer: BC Managed Care – PPO | Source: Ambulatory Visit | Attending: Neurological Surgery | Admitting: Neurological Surgery

## 2013-11-06 ENCOUNTER — Ambulatory Visit (HOSPITAL_COMMUNITY)
Admission: RE | Admit: 2013-11-06 | Discharge: 2013-11-06 | Disposition: A | Discharge status: Home / Self Care | Payer: BC Managed Care – PPO | Source: Ambulatory Visit | Attending: Neurological Surgery | Admitting: Neurological Surgery

## 2013-11-06 DIAGNOSIS — I700 Atherosclerosis of aorta: Secondary | ICD-10-CM | POA: Insufficient documentation

## 2013-11-06 DIAGNOSIS — Z01810 Encounter for preprocedural cardiovascular examination: Secondary | ICD-10-CM | POA: Insufficient documentation

## 2013-11-06 DIAGNOSIS — Z01812 Encounter for preprocedural laboratory examination: Secondary | ICD-10-CM | POA: Insufficient documentation

## 2013-11-06 DIAGNOSIS — Z01818 Encounter for other preprocedural examination: Secondary | ICD-10-CM | POA: Insufficient documentation

## 2013-11-06 LAB — CBC WITH DIFFERENTIAL/PLATELET
BASOS ABS: 0 10*3/uL (ref 0.0–0.1)
Basophils Relative: 1 % (ref 0–1)
Eosinophils Absolute: 0.3 10*3/uL (ref 0.0–0.7)
Eosinophils Relative: 4 % (ref 0–5)
HEMATOCRIT: 42.5 % (ref 36.0–46.0)
Hemoglobin: 14.3 g/dL (ref 12.0–15.0)
LYMPHS PCT: 36 % (ref 12–46)
Lymphs Abs: 3 10*3/uL (ref 0.7–4.0)
MCH: 30.7 pg (ref 26.0–34.0)
MCHC: 33.6 g/dL (ref 30.0–36.0)
MCV: 91.2 fL (ref 78.0–100.0)
Monocytes Absolute: 0.5 10*3/uL (ref 0.1–1.0)
Monocytes Relative: 6 % (ref 3–12)
NEUTROS ABS: 4.4 10*3/uL (ref 1.7–7.7)
NEUTROS PCT: 53 % (ref 43–77)
Platelets: 325 10*3/uL (ref 150–400)
RBC: 4.66 MIL/uL (ref 3.87–5.11)
RDW: 13.4 % (ref 11.5–15.5)
WBC: 8.2 10*3/uL (ref 4.0–10.5)

## 2013-11-06 LAB — TYPE AND SCREEN
ABO/RH(D): O NEG
Antibody Screen: NEGATIVE

## 2013-11-06 LAB — BASIC METABOLIC PANEL WITH GFR
BUN: 14 mg/dL (ref 6–23)
CO2: 26 meq/L (ref 19–32)
Calcium: 10.1 mg/dL (ref 8.4–10.5)
Chloride: 100 meq/L (ref 96–112)
Creatinine, Ser: 0.95 mg/dL (ref 0.50–1.10)
GFR calc Af Amer: 70 mL/min — ABNORMAL LOW
GFR calc non Af Amer: 61 mL/min — ABNORMAL LOW
Glucose, Bld: 99 mg/dL (ref 70–99)
Potassium: 4.4 meq/L (ref 3.7–5.3)
Sodium: 139 meq/L (ref 137–147)

## 2013-11-06 LAB — SURGICAL PCR SCREEN
MRSA, PCR: NEGATIVE
Staphylococcus aureus: NEGATIVE

## 2013-11-06 LAB — PROTIME-INR
INR: 0.92 (ref 0.00–1.49)
Prothrombin Time: 12.2 s (ref 11.6–15.2)

## 2013-11-06 LAB — ABO/RH: ABO/RH(D): O NEG

## 2013-11-12 MED ORDER — DEXAMETHASONE SODIUM PHOSPHATE 10 MG/ML IJ SOLN
10.0000 mg | INTRAMUSCULAR | Status: AC
  Administered 2013-11-13: 10 mg via INTRAVENOUS
  Filled 2013-11-12: qty 1

## 2013-11-12 MED ORDER — CEFAZOLIN SODIUM-DEXTROSE 2-3 GM-% IV SOLR
2.0000 g | INTRAVENOUS | Status: AC
  Administered 2013-11-13 (×2): 2 g via INTRAVENOUS
  Filled 2013-11-12: qty 50

## 2013-11-13 ENCOUNTER — Encounter (HOSPITAL_COMMUNITY): Payer: BC Managed Care – PPO | Admitting: Certified Registered Nurse Anesthetist

## 2013-11-13 ENCOUNTER — Encounter (HOSPITAL_COMMUNITY)
Admission: RE | Disposition: A | Discharge status: Home / Self Care | Payer: Self-pay | Source: Ambulatory Visit | Attending: Neurological Surgery

## 2013-11-13 ENCOUNTER — Inpatient Hospital Stay (HOSPITAL_COMMUNITY)
Admission: RE | Admit: 2013-11-13 | Discharge: 2013-11-16 | DRG: 455 | Disposition: A | Discharge status: Home / Self Care | Payer: BC Managed Care – PPO | Source: Ambulatory Visit | Attending: Neurological Surgery | Admitting: Neurological Surgery

## 2013-11-13 ENCOUNTER — Inpatient Hospital Stay (HOSPITAL_COMMUNITY): Payer: BC Managed Care – PPO

## 2013-11-13 ENCOUNTER — Inpatient Hospital Stay (HOSPITAL_COMMUNITY): Payer: BC Managed Care – PPO | Admitting: Certified Registered Nurse Anesthetist

## 2013-11-13 ENCOUNTER — Encounter (HOSPITAL_COMMUNITY): Payer: Self-pay

## 2013-11-13 DIAGNOSIS — E785 Hyperlipidemia, unspecified: Secondary | ICD-10-CM | POA: Diagnosis present

## 2013-11-13 DIAGNOSIS — Z981 Arthrodesis status: Secondary | ICD-10-CM | POA: Diagnosis not present

## 2013-11-13 DIAGNOSIS — I10 Essential (primary) hypertension: Secondary | ICD-10-CM | POA: Diagnosis present

## 2013-11-13 DIAGNOSIS — Z7982 Long term (current) use of aspirin: Secondary | ICD-10-CM

## 2013-11-13 DIAGNOSIS — K219 Gastro-esophageal reflux disease without esophagitis: Secondary | ICD-10-CM | POA: Diagnosis present

## 2013-11-13 DIAGNOSIS — M129 Arthropathy, unspecified: Secondary | ICD-10-CM | POA: Diagnosis present

## 2013-11-13 DIAGNOSIS — M48061 Spinal stenosis, lumbar region without neurogenic claudication: Secondary | ICD-10-CM | POA: Diagnosis present

## 2013-11-13 DIAGNOSIS — M4120 Other idiopathic scoliosis, site unspecified: Secondary | ICD-10-CM | POA: Diagnosis present

## 2013-11-13 DIAGNOSIS — M549 Dorsalgia, unspecified: Secondary | ICD-10-CM | POA: Diagnosis present

## 2013-11-13 HISTORY — PX: MAXIMUM ACCESS (MAS)POSTERIOR LUMBAR INTERBODY FUSION (PLIF) 1 LEVEL: SHX6368

## 2013-11-13 HISTORY — PX: ANTERIOR LAT LUMBAR FUSION: SHX1168

## 2013-11-13 LAB — D-DIMER, QUANTITATIVE (NOT AT ARMC): D DIMER QUANT: 2.53 ug{FEU}/mL — AB (ref 0.00–0.48)

## 2013-11-13 LAB — CBC
HCT: 36.2 % (ref 36.0–46.0)
HEMOGLOBIN: 11.9 g/dL — AB (ref 12.0–15.0)
MCH: 30.1 pg (ref 26.0–34.0)
MCHC: 32.9 g/dL (ref 30.0–36.0)
MCV: 91.6 fL (ref 78.0–100.0)
Platelets: 268 10*3/uL (ref 150–400)
RBC: 3.95 MIL/uL (ref 3.87–5.11)
RDW: 13.4 % (ref 11.5–15.5)
WBC: 17.6 10*3/uL — ABNORMAL HIGH (ref 4.0–10.5)

## 2013-11-13 SURGERY — ANTERIOR LATERAL LUMBAR FUSION 3 LEVELS
Anesthesia: General | Site: Back | Laterality: Right

## 2013-11-13 MED ORDER — SENNA 8.6 MG PO TABS
1.0000 | ORAL_TABLET | Freq: Two times a day (BID) | ORAL | Status: DC
  Administered 2013-11-14 – 2013-11-16 (×5): 8.6 mg via ORAL
  Filled 2013-11-13 (×8): qty 1

## 2013-11-13 MED ORDER — MENTHOL 3 MG MT LOZG: 1.0000 | LOZENGE | OROMUCOSAL | Status: DC | PRN

## 2013-11-13 MED ORDER — 0.9 % SODIUM CHLORIDE (POUR BTL) OPTIME
TOPICAL | Status: DC | PRN
  Administered 2013-11-13 (×2): 1000 mL

## 2013-11-13 MED ORDER — CELECOXIB 200 MG PO CAPS
200.0000 mg | ORAL_CAPSULE | Freq: Two times a day (BID) | ORAL | Status: DC
  Administered 2013-11-13 – 2013-11-14 (×2): 200 mg via ORAL
  Filled 2013-11-13 (×3): qty 1

## 2013-11-13 MED ORDER — SUCCINYLCHOLINE CHLORIDE 20 MG/ML IJ SOLN
INTRAMUSCULAR | Status: DC | PRN
  Administered 2013-11-13: 100 mg via INTRAVENOUS

## 2013-11-13 MED ORDER — ACETAMINOPHEN 325 MG PO TABS: 650.0000 mg | ORAL_TABLET | ORAL | Status: DC | PRN

## 2013-11-13 MED ORDER — LACTATED RINGERS IV SOLN
INTRAVENOUS | Status: DC
Start: 2013-11-13 — End: 2013-11-13
  Administered 2013-11-13 (×4): via INTRAVENOUS

## 2013-11-13 MED ORDER — PROPOFOL 10 MG/ML IV BOLUS
INTRAVENOUS | Status: AC
  Filled 2013-11-13: qty 20

## 2013-11-13 MED ORDER — ARTIFICIAL TEARS OP OINT
TOPICAL_OINTMENT | OPHTHALMIC | Status: AC
  Filled 2013-11-13: qty 3.5

## 2013-11-13 MED ORDER — THROMBIN 5000 UNITS EX SOLR
OROMUCOSAL | Status: DC | PRN
  Administered 2013-11-13: 11:00:00 via TOPICAL

## 2013-11-13 MED ORDER — CEFAZOLIN SODIUM-DEXTROSE 2-3 GM-% IV SOLR
INTRAVENOUS | Status: AC
  Filled 2013-11-13: qty 50

## 2013-11-13 MED ORDER — ONDANSETRON HCL 4 MG/2ML IJ SOLN
4.0000 mg | INTRAMUSCULAR | Status: DC | PRN
  Administered 2013-11-13 – 2013-11-14 (×4): 4 mg via INTRAVENOUS
  Filled 2013-11-13 (×4): qty 2

## 2013-11-13 MED ORDER — PROPOFOL INFUSION 10 MG/ML OPTIME
INTRAVENOUS | Status: DC | PRN
  Administered 2013-11-13: 50 ug/kg/min via INTRAVENOUS

## 2013-11-13 MED ORDER — DEXAMETHASONE 4 MG PO TABS
4.0000 mg | ORAL_TABLET | Freq: Four times a day (QID) | ORAL | Status: DC
  Administered 2013-11-14 – 2013-11-16 (×7): 4 mg via ORAL
  Filled 2013-11-13 (×14): qty 1

## 2013-11-13 MED ORDER — KETOROLAC TROMETHAMINE 30 MG/ML IJ SOLN
INTRAMUSCULAR | Status: DC | PRN
  Administered 2013-11-13: 30 mg via INTRAVENOUS

## 2013-11-13 MED ORDER — CEFAZOLIN SODIUM 1-5 GM-% IV SOLN
1.0000 g | Freq: Three times a day (TID) | INTRAVENOUS | Status: AC
  Administered 2013-11-13 – 2013-11-14 (×2): 1 g via INTRAVENOUS
  Filled 2013-11-13 (×2): qty 50

## 2013-11-13 MED ORDER — SODIUM CHLORIDE 0.9 % IR SOLN
Status: DC | PRN
  Administered 2013-11-13 (×2)

## 2013-11-13 MED ORDER — PIROXICAM 20 MG PO CAPS: 20.0000 mg | ORAL_CAPSULE | Freq: Every day | ORAL | Status: DC

## 2013-11-13 MED ORDER — ONDANSETRON HCL 4 MG/2ML IJ SOLN: 4.0000 mg | Freq: Four times a day (QID) | INTRAMUSCULAR | Status: DC | PRN

## 2013-11-13 MED ORDER — NALOXONE HCL 0.4 MG/ML IJ SOLN
0.4000 mg | INTRAMUSCULAR | Status: DC | PRN
Start: 2013-11-13 — End: 2013-11-14

## 2013-11-13 MED ORDER — ACETAMINOPHEN 650 MG RE SUPP: 650.0000 mg | RECTAL | Status: DC | PRN

## 2013-11-13 MED ORDER — ALUM & MAG HYDROXIDE-SIMETH 200-200-20 MG/5ML PO SUSP
30.0000 mL | ORAL | Status: DC | PRN
  Administered 2013-11-13: 30 mL via ORAL
  Filled 2013-11-13: qty 30

## 2013-11-13 MED ORDER — HYDROMORPHONE 0.3 MG/ML IV SOLN
INTRAVENOUS | Status: DC
  Administered 2013-11-13: 0.999 mg via INTRAVENOUS
  Administered 2013-11-14: 0.2 mg via INTRAVENOUS

## 2013-11-13 MED ORDER — HYDROMORPHONE 0.3 MG/ML IV SOLN
INTRAVENOUS | Status: AC
  Administered 2013-11-13: 0.3 mg
  Filled 2013-11-13: qty 25

## 2013-11-13 MED ORDER — LIDOCAINE HCL (CARDIAC) 20 MG/ML IV SOLN
INTRAVENOUS | Status: AC
  Filled 2013-11-13: qty 10

## 2013-11-13 MED ORDER — FENTANYL CITRATE 0.05 MG/ML IJ SOLN
25.0000 ug | INTRAMUSCULAR | Status: DC | PRN
Start: 2013-11-13 — End: 2013-11-13
  Administered 2013-11-13 (×2): 25 ug via INTRAVENOUS

## 2013-11-13 MED ORDER — KETOROLAC TROMETHAMINE 30 MG/ML IJ SOLN
INTRAMUSCULAR | Status: AC
  Filled 2013-11-13: qty 1

## 2013-11-13 MED ORDER — ACETAMINOPHEN 10 MG/ML IV SOLN
INTRAVENOUS | Status: AC
  Administered 2013-11-13: 1000 mg via INTRAVENOUS
  Filled 2013-11-13: qty 100

## 2013-11-13 MED ORDER — POTASSIUM CHLORIDE IN NACL 20-0.9 MEQ/L-% IV SOLN
INTRAVENOUS | Status: DC
  Administered 2013-11-13: 1000 mL via INTRAVENOUS
  Administered 2013-11-14: 14:00:00 via INTRAVENOUS
  Filled 2013-11-13 (×6): qty 1000

## 2013-11-13 MED ORDER — SODIUM CHLORIDE 0.9 % IJ SOLN
3.0000 mL | Freq: Two times a day (BID) | INTRAMUSCULAR | Status: DC
  Administered 2013-11-14 – 2013-11-15 (×2): 3 mL via INTRAVENOUS

## 2013-11-13 MED ORDER — ONDANSETRON HCL 4 MG/2ML IJ SOLN
INTRAMUSCULAR | Status: DC | PRN
  Administered 2013-11-13: 4 mg via INTRAVENOUS

## 2013-11-13 MED ORDER — FENTANYL CITRATE 0.05 MG/ML IJ SOLN
INTRAMUSCULAR | Status: AC
  Filled 2013-11-13: qty 5

## 2013-11-13 MED ORDER — SURGIFOAM 100 EX MISC
CUTANEOUS | Status: DC | PRN
  Administered 2013-11-13: 11:00:00 via TOPICAL

## 2013-11-13 MED ORDER — MIDAZOLAM HCL 5 MG/5ML IJ SOLN
INTRAMUSCULAR | Status: DC | PRN
  Administered 2013-11-13: 2 mg via INTRAVENOUS

## 2013-11-13 MED ORDER — ASPIRIN EC 81 MG PO TBEC
81.0000 mg | DELAYED_RELEASE_TABLET | Freq: Every day | ORAL | Status: DC
  Administered 2013-11-14 – 2013-11-16 (×3): 81 mg via ORAL
  Filled 2013-11-13 (×3): qty 1

## 2013-11-13 MED ORDER — PHENYLEPHRINE HCL 10 MG/ML IJ SOLN
INTRAMUSCULAR | Status: DC | PRN
  Administered 2013-11-13 (×3): 40 ug via INTRAVENOUS

## 2013-11-13 MED ORDER — LISINOPRIL 5 MG PO TABS
5.0000 mg | ORAL_TABLET | Freq: Every day | ORAL | Status: DC
  Administered 2013-11-15 – 2013-11-16 (×2): 5 mg via ORAL
  Filled 2013-11-13 (×4): qty 1

## 2013-11-13 MED ORDER — SODIUM CHLORIDE 0.9 % IJ SOLN: 9.0000 mL | INTRAMUSCULAR | Status: DC | PRN

## 2013-11-13 MED ORDER — FENTANYL CITRATE 0.05 MG/ML IJ SOLN
INTRAMUSCULAR | Status: DC | PRN
Start: 2013-11-13 — End: 2013-11-13
  Administered 2013-11-13 (×11): 50 ug via INTRAVENOUS

## 2013-11-13 MED ORDER — PHENYLEPHRINE HCL 10 MG/ML IJ SOLN
10.0000 mg | INTRAVENOUS | Status: DC | PRN
  Administered 2013-11-13: 10 ug/min via INTRAVENOUS

## 2013-11-13 MED ORDER — MIDAZOLAM HCL 2 MG/2ML IJ SOLN
INTRAMUSCULAR | Status: AC
  Filled 2013-11-13: qty 2

## 2013-11-13 MED ORDER — DIPHENHYDRAMINE HCL 12.5 MG/5ML PO ELIX: 12.5000 mg | ORAL_SOLUTION | Freq: Four times a day (QID) | ORAL | Status: DC | PRN

## 2013-11-13 MED ORDER — SODIUM CHLORIDE 0.9 % IV SOLN
250.0000 mL | INTRAVENOUS | Status: DC
Start: 2013-11-13 — End: 2013-11-16

## 2013-11-13 MED ORDER — EPHEDRINE SULFATE 50 MG/ML IJ SOLN
INTRAMUSCULAR | Status: DC | PRN
  Administered 2013-11-13 (×3): 5 mg via INTRAVENOUS

## 2013-11-13 MED ORDER — DEXAMETHASONE SODIUM PHOSPHATE 4 MG/ML IJ SOLN
4.0000 mg | Freq: Four times a day (QID) | INTRAMUSCULAR | Status: DC
  Administered 2013-11-13 – 2013-11-15 (×4): 4 mg via INTRAVENOUS
  Filled 2013-11-13 (×14): qty 1

## 2013-11-13 MED ORDER — MAGNESIUM CITRATE PO SOLN
1.0000 | Freq: Once | ORAL | Status: AC | PRN
  Filled 2013-11-13: qty 296

## 2013-11-13 MED ORDER — FENTANYL CITRATE 0.05 MG/ML IJ SOLN
INTRAMUSCULAR | Status: AC
  Filled 2013-11-13: qty 2

## 2013-11-13 MED ORDER — PHENOL 1.4 % MT LIQD: 1.0000 | OROMUCOSAL | Status: DC | PRN

## 2013-11-13 MED ORDER — PROPOFOL 10 MG/ML IV BOLUS
INTRAVENOUS | Status: DC | PRN
  Administered 2013-11-13: 150 mg via INTRAVENOUS

## 2013-11-13 MED ORDER — PHENYLEPHRINE 40 MCG/ML (10ML) SYRINGE FOR IV PUSH (FOR BLOOD PRESSURE SUPPORT)
PREFILLED_SYRINGE | INTRAVENOUS | Status: AC
  Filled 2013-11-13: qty 10

## 2013-11-13 MED ORDER — DIPHENHYDRAMINE HCL 50 MG/ML IJ SOLN: 12.5000 mg | Freq: Four times a day (QID) | INTRAMUSCULAR | Status: DC | PRN

## 2013-11-13 MED ORDER — ONDANSETRON HCL 4 MG/2ML IJ SOLN
INTRAMUSCULAR | Status: AC
  Filled 2013-11-13: qty 2

## 2013-11-13 MED ORDER — ALBUMIN HUMAN 5 % IV SOLN
INTRAVENOUS | Status: DC | PRN
  Administered 2013-11-13: 14:00:00 via INTRAVENOUS

## 2013-11-13 MED ORDER — BUPIVACAINE HCL (PF) 0.25 % IJ SOLN
INTRAMUSCULAR | Status: DC | PRN
  Administered 2013-11-13: 5 mL
  Administered 2013-11-13: 9 mL

## 2013-11-13 MED ORDER — DIAZEPAM 5 MG PO TABS
5.0000 mg | ORAL_TABLET | Freq: Four times a day (QID) | ORAL | Status: DC | PRN
  Filled 2013-11-13: qty 1

## 2013-11-13 MED ORDER — SODIUM CHLORIDE 0.9 % IJ SOLN: 3.0000 mL | INTRAMUSCULAR | Status: DC | PRN

## 2013-11-13 SURGICAL SUPPLY — 80 items
BAG DECANTER FOR FLEXI CONT (MISCELLANEOUS) ×10 IMPLANT
BENZOIN TINCTURE PRP APPL 2/3 (GAUZE/BANDAGES/DRESSINGS) ×5 IMPLANT
BLADE 10 SAFETY STRL DISP (BLADE) IMPLANT
BLADE SURG ROTATE 9660 (MISCELLANEOUS) IMPLANT
BONE MATRIX OSTEOCEL PRO MED (Bone Implant) ×20 IMPLANT
BUR MATCHSTICK NEURO 3.0 LAGG (BURR) ×5 IMPLANT
CAGE PLIF 8X9X23-12 LUMBAR (Cage) ×10 IMPLANT
CANISTER SUCT 3000ML (MISCELLANEOUS) ×5 IMPLANT
CLIP NEUROVISION LG (CLIP) ×5 IMPLANT
CLOSURE WOUND 1/2 X4 (GAUZE/BANDAGES/DRESSINGS) ×3
CONT SPEC 4OZ CLIKSEAL STRL BL (MISCELLANEOUS) ×10 IMPLANT
COROENT XL-W 8X22X50 (Orthopedic Implant) ×5 IMPLANT
COVER BACK TABLE 24X17X13 BIG (DRAPES) ×5 IMPLANT
COVER TABLE BACK 60X90 (DRAPES) ×5 IMPLANT
DERMABOND ADHESIVE PROPEN (GAUZE/BANDAGES/DRESSINGS) ×4
DERMABOND ADVANCED (GAUZE/BANDAGES/DRESSINGS) ×4
DERMABOND ADVANCED .7 DNX12 (GAUZE/BANDAGES/DRESSINGS) ×6 IMPLANT
DERMABOND ADVANCED .7 DNX6 (GAUZE/BANDAGES/DRESSINGS) ×6 IMPLANT
DRAPE C-ARM 42X72 X-RAY (DRAPES) ×10 IMPLANT
DRAPE C-ARMOR (DRAPES) ×10 IMPLANT
DRAPE LAPAROTOMY 100X72X124 (DRAPES) ×10 IMPLANT
DRAPE POUCH INSTRU U-SHP 10X18 (DRAPES) ×10 IMPLANT
DRAPE SURG 17X23 STRL (DRAPES) ×25 IMPLANT
DRSG OPSITE 4X5.5 SM (GAUZE/BANDAGES/DRESSINGS) ×5 IMPLANT
DRSG OPSITE POSTOP 4X8 (GAUZE/BANDAGES/DRESSINGS) ×5 IMPLANT
DRSG TELFA 3X8 NADH (GAUZE/BANDAGES/DRESSINGS) IMPLANT
DURAPREP 26ML APPLICATOR (WOUND CARE) ×10 IMPLANT
ELECT REM PT RETURN 9FT ADLT (ELECTROSURGICAL) ×10
ELECTRODE REM PT RTRN 9FT ADLT (ELECTROSURGICAL) ×6 IMPLANT
EVACUATOR 1/8 PVC DRAIN (DRAIN) ×5 IMPLANT
GAUZE SPONGE 4X4 16PLY XRAY LF (GAUZE/BANDAGES/DRESSINGS) IMPLANT
GLOVE BIO SURGEON STRL SZ8 (GLOVE) ×15 IMPLANT
GLOVE BIOGEL PI IND STRL 7.0 (GLOVE) ×9 IMPLANT
GLOVE BIOGEL PI INDICATOR 7.0 (GLOVE) ×6
GLOVE ECLIPSE 9.0 STRL (GLOVE) ×5 IMPLANT
GLOVE OPTIFIT SS 6.5 STRL BRWN (GLOVE) ×20 IMPLANT
GLOVE SS N UNI LF 7.5 STRL (GLOVE) ×10 IMPLANT
GOWN STRL REUS W/ TWL LRG LVL3 (GOWN DISPOSABLE) ×6 IMPLANT
GOWN STRL REUS W/ TWL XL LVL3 (GOWN DISPOSABLE) ×12 IMPLANT
GOWN STRL REUS W/TWL 2XL LVL3 (GOWN DISPOSABLE) IMPLANT
GOWN STRL REUS W/TWL LRG LVL3 (GOWN DISPOSABLE) ×4
GOWN STRL REUS W/TWL XL LVL3 (GOWN DISPOSABLE) ×8
HEMOSTAT POWDER KIT SURGIFOAM (HEMOSTASIS) IMPLANT
IMPLANT COROENT LORDOT 8X18X50 ×5 IMPLANT
IMPLANT COROENT XL 8X45X18MM ×5 IMPLANT
KIT BASIN OR (CUSTOM PROCEDURE TRAY) ×10 IMPLANT
KIT DILATOR XLIF 5 (KITS) ×3 IMPLANT
KIT MAXCESS (KITS) ×5 IMPLANT
KIT NEEDLE NVM5 EMG ELECT (KITS) ×3 IMPLANT
KIT NEEDLE NVM5 EMG ELECTRODE (KITS) ×2
KIT ROOM TURNOVER OR (KITS) ×5 IMPLANT
KIT XLIF (KITS) ×2
MILL MEDIUM DISP (BLADE) IMPLANT
NEEDLE HYPO 25X1 1.5 SAFETY (NEEDLE) ×10 IMPLANT
NS IRRIG 1000ML POUR BTL (IV SOLUTION) ×10 IMPLANT
PACK LAMINECTOMY NEURO (CUSTOM PROCEDURE TRAY) ×10 IMPLANT
PAD ARMBOARD 7.5X6 YLW CONV (MISCELLANEOUS) ×15 IMPLANT
ROD FIXATION MAS PREBENT1 110M (Rod) ×10 IMPLANT
SCREW LOCK (Screw) ×20 IMPLANT
SCREW LOCK FXNS SPNE MAS PL (Screw) ×30 IMPLANT
SCREW SHANK 5.0X30MM (Screw) ×25 IMPLANT
SCREW SHANK 5.0X35 (Screw) ×5 IMPLANT
SCREW SHANK PLIF 5.0X25 LUMBAR (Screw) ×10 IMPLANT
SCREW TULIP 5.5 (Screw) ×40 IMPLANT
SPONGE LAP 4X18 X RAY DECT (DISPOSABLE) IMPLANT
SPONGE SURGIFOAM ABS GEL 100 (HEMOSTASIS) ×5 IMPLANT
STRIP CLOSURE SKIN 1/2X4 (GAUZE/BANDAGES/DRESSINGS) ×12 IMPLANT
SUT VIC AB 0 CT1 18XCR BRD8 (SUTURE) ×9 IMPLANT
SUT VIC AB 0 CT1 8-18 (SUTURE) ×6
SUT VIC AB 2-0 CP2 18 (SUTURE) ×15 IMPLANT
SUT VIC AB 3-0 SH 8-18 (SUTURE) ×25 IMPLANT
SYR 20ML ECCENTRIC (SYRINGE) ×10 IMPLANT
SYR 3ML LL SCALE MARK (SYRINGE) IMPLANT
TAPE CLOTH 3X10 TAN LF (GAUZE/BANDAGES/DRESSINGS) ×15 IMPLANT
TAPE STRIPS DRAPE STRL (GAUZE/BANDAGES/DRESSINGS) ×5 IMPLANT
TOWEL OR 17X24 6PK STRL BLUE (TOWEL DISPOSABLE) ×10 IMPLANT
TOWEL OR 17X26 10 PK STRL BLUE (TOWEL DISPOSABLE) ×10 IMPLANT
TRAP SPECIMEN MUCOUS 40CC (MISCELLANEOUS) IMPLANT
TRAY FOLEY CATH 14FRSI W/METER (CATHETERS) ×10 IMPLANT
WATER STERILE IRR 1000ML POUR (IV SOLUTION) ×10 IMPLANT

## 2013-11-13 NOTE — Anesthesia Preprocedure Evaluation (Signed)
Anesthesia Evaluation  Patient identified by MRN, date of birth, ID band Patient awake    Reviewed: Allergy & Precautions, H&P , NPO status , Patient's Chart, lab work & pertinent test results  Airway Mallampati: II      Dental   Pulmonary neg pulmonary ROS,  breath sounds clear to auscultation        Cardiovascular hypertension, Rhythm:Regular Rate:Normal     Neuro/Psych    GI/Hepatic Neg liver ROS, GERD-  ,  Endo/Other  negative endocrine ROS  Renal/GU negative Renal ROS     Musculoskeletal   Abdominal   Peds  Hematology   Anesthesia Other Findings   Reproductive/Obstetrics                           Anesthesia Physical Anesthesia Plan  ASA: III  Anesthesia Plan: General   Post-op Pain Management:    Induction: Intravenous  Airway Management Planned: Oral ETT  Additional Equipment:   Intra-op Plan:   Post-operative Plan: Extubation in OR  Informed Consent: I have reviewed the patients History and Physical, chart, labs and discussed the procedure including the risks, benefits and alternatives for the proposed anesthesia with the patient or authorized representative who has indicated his/her understanding and acceptance.   Dental advisory given  Plan Discussed with: CRNA, Anesthesiologist and Surgeon  Anesthesia Plan Comments:         Anesthesia Quick Evaluation

## 2013-11-13 NOTE — Anesthesia Procedure Notes (Signed)
Procedure Name: Intubation Date/Time: 11/13/2013 10:07 AM Performed by: YACOUB, ALISHA B Pre-anesthesia Checklist: Patient identified, Emergency Drugs available, Suction available, Patient being monitored and Timeout performed Patient Re-evaluated:Patient Re-evaluated prior to inductionOxygen Delivery Method: Circle system utilized Preoxygenation: Pre-oxygenation with 100% oxygen Intubation Type: IV induction Ventilation: Mask ventilation without difficulty Laryngoscope Size: Mac and 3 Grade View: Grade I Tube type: Oral Tube size: 7.0 mm Number of attempts: 1 Airway Equipment and Method: Stylet,  LTA kit utilized and Bite block (soft bite block utilized for procedure) Placement Confirmation: ETT inserted through vocal cords under direct vision,  positive ETCO2 and breath sounds checked- equal and bilateral Secured at: 21 cm Tube secured with: Tape Dental Injury: Teeth and Oropharynx as per pre-operative assessment      

## 2013-11-13 NOTE — Anesthesia Postprocedure Evaluation (Signed)
  Anesthesia Post-op Note  Patient: [name]  Procedure(s) Performed: Procedure(s) with comments: Extreme Lateral Lumbar Fusion L1-2, L2-3, L3-4 (Right) - Extreme Lateral Lumbar Fusion L1-2, L2-3, L3-4 FOR MAXIMUM ACCESS (MAS) POSTERIOR LUMBAR INTERBODY FUSION (PLIF) 1 LEVEL (N/A) - FOR MAXIMUM ACCESS (MAS) POSTERIOR LUMBAR INTERBODY FUSION (PLIF) 1 LEVEL L4-L5, Cortical Screws Lumbar 1-5  Patient Location: PACU  Anesthesia Type:General  Level of Consciousness: awake, alert  and oriented  Airway and Oxygen Therapy: Patient Spontanous Breathing  Post-op Pain: moderate  Post-op Assessment: Post-op Vital signs reviewed  Post-op Vital Signs: Reviewed  Last Vitals:  Filed Vitals:   11/13/13 1845  BP:   Pulse: 112  Temp:   Resp: 13    Complications: No apparent anesthesia complications

## 2013-11-13 NOTE — Transfer of Care (Signed)
Immediate Anesthesia Transfer of Care Note  Patient: [name]  Procedure(s) Performed: Procedure(s) with comments: Extreme Lateral Lumbar Fusion L1-2, L2-3, L3-4 (Right) - Extreme Lateral Lumbar Fusion L1-2, L2-3, L3-4 FOR MAXIMUM ACCESS (MAS) POSTERIOR LUMBAR INTERBODY FUSION (PLIF) 1 LEVEL (N/A) - FOR MAXIMUM ACCESS (MAS) POSTERIOR LUMBAR INTERBODY FUSION (PLIF) 1 LEVEL L4-L5, Cortical Screws Lumbar 1-5  Patient Location: PACU  Anesthesia Type:General  Level of Consciousness: sedated, follows commands  Airway & Oxygen Therapy: Patient Spontanous Breathing and Patient connected to face mask oxygen  Post-op Assessment: Report given to PACU RN and Post -op Vital signs reviewed and stable  Post vital signs: Reviewed and stable  Complications: No apparent anesthesia complications

## 2013-11-13 NOTE — H&P (Signed)
Subjective: Patient is a 68 y.o. female admitted for multilevel lumbar fusion for severe spondylosis and stenosis. She underwent multilevel decompressive laminectomy by another surgeon in the past.  Onset of symptoms was several months ago, gradually worsening since that time.  The pain is rated severe, and is located at the across the lower back and radiates to legs. The pain is described as aching and occurs all day. The symptoms have been progressive. Symptoms are exacerbated by exercise and standing. MRI or CT showed multilevel spondylosis and scoliosis with foraminal stenosis and central stenosis, spondylolisthesis L4-5, previous decompressive laminectomy L2-L4.   Past Medical History  Diagnosis Date  . Spinal stenosis   . Hypertension   . Hyperlipidemia   . GERD (gastroesophageal reflux disease)   . Arthritis   . Complication of anesthesia     "slow to wake up"    Past Surgical History  Procedure Laterality Date  . Achilles tendon repair      right  . Carpal tunnel release Bilateral   . Lumbar laminectomy/decompression microdiscectomy N/A 07/30/2012    Procedure: Lumbar Decompression L1 - S1 (X-Ray)4 LEVEL;  Surgeon: Johnn Hai, MD;  Location: [facility]S;  Service: Orthopedics;  Laterality: N/A;  Lumbar Decompression L1 - S1 (X-Ray)    Prior to Admission medications   Medication Sig Start Date End Date Taking? Authorizing Provider  aspirin EC 81 MG tablet Take 81 mg by mouth daily. States last dose will be 07/23/12 08/01/12  Yes Jaclyn M. Bissell, PA-C  Calcium Carbonate-Vitamin D (CALCIUM 600 + D PO) Take 1 tablet by mouth daily.    Yes Historical Provider, MD  dimenhyDRINATE (DRAMAMINE) 50 MG tablet Take 50 mg by mouth at bedtime as needed for nausea.    Yes Historical Provider, MD  lisinopril (PRINIVIL,ZESTRIL) 5 MG tablet Take 5 mg by mouth daily after breakfast.   Yes Historical Provider, MD  Multiple Vitamin (MULTIVITAMIN WITH MINERALS) TABS Take 1 tablet by mouth daily. States  will stop 07/23/12   Yes Historical Provider, MD  Omega-3 Fatty Acids (FISH OIL) 1200 MG CAPS Take 1,200 mg by mouth 2 (two) times daily with a meal.   Yes Historical Provider, MD  piroxicam (FELDENE) 20 MG capsule Take 20 mg by mouth daily after supper. States last dose 07/23/12   Yes Historical Provider, MD  pravastatin (PRAVACHOL) 40 MG tablet Take 80 mg by mouth daily after supper.   Yes Historical Provider, MD  zolpidem (AMBIEN) 10 MG tablet Take 10 mg by mouth at bedtime.   Yes Historical Provider, MD   Allergies  Allergen Reactions  . Percocet [Oxycodone-Acetaminophen] Nausea Only    History  Substance Use Topics  . Smoking status: Never Smoker   . Smokeless tobacco: Never Used  . Alcohol Use: No    History reviewed. No pertinent family history.   Review of Systems  Positive ROS: Negative  All other systems have been reviewed and were otherwise negative with the exception of those mentioned in the HPI and as above.  Objective: Vital signs in last 24 hours:    General Appearance: Alert, cooperative, no distress, appears stated age Head: Normocephalic, without obvious abnormality, atraumatic Eyes: PERRL, conjunctiva/corneas clear, EOM's intact    Neck: Supple, symmetrical, trachea midline Back: Symmetric, no curvature, ROM normal, no CVA tenderness Lungs:  respirations unlabored Heart: Regular rate and rhythm Abdomen: Soft, non-tender Extremities: Extremities normal, atraumatic, no cyanosis or edema Pulses: 2+ and symmetric all extremities Skin: Skin color, texture, turgor normal, no  rashes or lesions  NEUROLOGIC:   Mental status: Alert and oriented x4,  no aphasia, good attention span, fund of knowledge, and memory Motor Exam - grossly normal Sensory Exam - grossly normal Reflexes: 1+ Coordination - grossly normal Gait - grossly normal Balance - grossly normal Cranial Nerves: I: smell Not tested  II: visual acuity  OS: nl    OD: nl  II: visual fields Full to  confrontation  II: pupils Equal, round, reactive to light  III,VII: ptosis None  III,IV,VI: extraocular muscles  Full ROM  V: mastication Normal  V: facial light touch sensation  Normal  V,VII: corneal reflex  Present  VII: facial muscle function - upper  Normal  VII: facial muscle function - lower Normal  VIII: hearing Not tested  IX: soft palate elevation  Normal  IX,X: gag reflex Present  XI: trapezius strength  5/5  XI: sternocleidomastoid strength 5/5  XI: neck flexion strength  5/5  XII: tongue strength  Normal    Data Review Lab Results  Component Value Date   WBC 8.2 11/06/2013   HGB 14.3 11/06/2013   HCT 42.5 11/06/2013   MCV 91.2 11/06/2013   PLT 325 11/06/2013   Lab Results  Component Value Date   NA 139 11/06/2013   K 4.4 11/06/2013   CL 100 11/06/2013   CO2 26 11/06/2013   BUN 14 11/06/2013   CREATININE 0.95 11/06/2013   GLUCOSE 99 11/06/2013   Lab Results  Component Value Date   INR 0.92 11/06/2013    Assessment/Plan: Patient admitted for three-level decompression and instrumented fusion for double deformity with stenosis and spondylolisthesis. Patient has failed a reasonable attempt at conservative therapy.  I explained the condition and procedure to the patient and answered any questions.  Patient wishes to proceed with procedure as planned. Understands risks/ benefits and typical outcomes of procedure.   JONES,DAVID S 11/13/2013 9:04 AM

## 2013-11-13 NOTE — Op Note (Signed)
11/13/2013  4:37 PM  PATIENT:  [name]  68 y.o. 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 Decompressive lumbar laminectomy, hemi-facetectomy, and foraminotomies L4-5 requiring more work than would be required for simple PLIF exposure in order to adequately decompress the neural elements, 2. Posterior lumbar interbody fusion L4-5 utilizing peek interbody cages packed with local autograft and morcellized allograft, 3. Posterolateral arthrodesis L1-2, L2-3, L3-4, L4-5 utilizing local autograft and morcellized allograft, 4. Anterolateral retroperitoneal interbody fusion utilizing peek interbody cages packed with morcellized allograft at L1-2, L2-3, L3-4, 5. Segmental fixation L1-L5 utilizing Nuvasive cortical pedicle screws  SURGEON:  Sherley Bounds, MD  ASSISTANTS: Dr. Annette Stable  ANESTHESIA:   General  EBL: 600 ml  Total I/O In: 3350 [I.V.:3000; Blood:100; IV Piggyback:250] Out: 2423 [Urine:585; Blood:600]  BLOOD ADMINISTERED:100 CC PRBC  DRAINS: Medium Hemovac   SPECIMEN:  No Specimen  INDICATION FOR PROCEDURE: This patient underwent a previous decompressive laminectomy at L1-2 L3-4 L4-5 by another surgeon.  History of back and leg pain. DG myelogram showed a post laminectomy spondylolisthesis at L4-5 with recurrent stenosis. He had severe foraminal stenosis at L1-L2 L2-3 and L3-4 with central stenosis at L1-2 especially with extension. He tried medical management without relief for quite a long time. I recommended a decompression instrumented fusion to address her spinal deformity and spinal stenosis. Her pain interfered with her quality of life and activities of daily living and she felt a reasonable candidate medical management, injection  therapy, and physical therapy.  Patient understood the risks, benefits, and alternatives and potential outcomes and wished to proceed.  PROCEDURE DETAILS: The patient was brought to the operating room and after induction of adequate generalized endotracheal anesthesia, the patient was positioned in the left lateral decubitus position exposing the right side. The patient was positioned in the typical XLIF fashion and taped into position and trajectories were confirmed with AP lateral fluoroscopy. The skin was cleaned for 3 minutes and then prepped with DuraPrep and draped in the usual sterile fashion. 5 cc of local anesthesia was injected. A lateral incision was made over the appropriate disc space and a incision was made posterior lateral to this. Blunt finger dissection was used to enter the retroperitoneal space. I could palpate the psoas musculature as well as the anterior face of the transverse process, and I could feel the fatty tissues of the retroperitoneum. I swept my finger up to the lateral incision and passed my first dilator down to psoas musculature utilizing my index finger. We then used EMG monitoring to monitor our dilator. Each disc was done in the exact same manner. We checked our position with fluoroscopy and then placed a K wire into the disc space, and then used sequential dilators until our final retractor was in place. We then positioned it utilizing AP lateral fluoroscopy, and used our ball probe to make sure there were no neural structures within the working channel at each level. I then placed the intradiscal shim, and opened my retractor further. The annulus was then incised and the discectomy was done with pituitaries. The annulus was removed from the endplates with a Cobb and the opposite annulus was opened and released at each level. I then used a series of scrapers and shavers to prepare the endplates, taking care not to violate the endplates.  I then placed my 16 mm paddle across  the disc space to further open opposite annulus and I checked the trajectory with AP and lateral fluoroscopy. I then used sequential trials to determine the correct size cage. The 8 mm lordotic trial fit the best at each level, so a corresponding cage was selected and packed with morcellized allograft. Using sliders it was then tapped gently into the disc space utilizing AP fluoroscopy until it was appropriately positioned. I then irrigated with saline solution containing bacitracin and dried any bleeding points. I checked my final construct with AP lateral fluoroscopy, removed my retractor, and closed the incisions with layers of 0 Vicryl in the fascia, 2-0 Vicryl in the subcutaneous tissues, and 3-0 Vicryl in the subcuticular tissues. The skin was closed with Dermabond. The drapes were then removed and the patient was turned into the prone position on chest rolls, all pressure points were padded. The lumbar region was then cleaned once again and prepped with DuraPrep and draped in usual sterile fashion and 9 cc of local anesthesia was injected.    A dorsal midline incision was made and carried down to the lumbosacral fascia. The fascia was opened and the paraspinous musculature was taken down in a subperiosteal fashion to expose L1-L5. Significant scar from the previous surgery but the pars at each level was identified. A self-retaining retractor was placed. Intraoperative fluoroscopy confirmed my level, and I started with placement of the L1 to L4 cortical pedicle screws. Each screw was put in the exact same way . The pedicle screw entry zones were identified utilizing surface landmarks and  AP and lateral fluoroscopy. I scored the cortex with the high-speed drill and then used the hand drill and EMG monitoring to drill an upward and outward direction into the pedicle. I then tapped line to line, and the tap was also monitored. I then placed a 5-0 x 30 mm cortical pedicle screw into the pedicles of L1, L2, L3,  and L4 bilaterally. I then turned my attention to the decompression and the spinous process was removed and complete lumbar laminectomies, hemi- facetectomies, and foraminotomies were performed at L4-5. The patient had significant spinal stenosis and this required more work than would be required for a simple exposure of the disc for posterior lumbar interbody fusion. Much more generous decompression was undertaken in order to adequately decompress the neural elements and address the patient's leg pain. The yellow ligament was removed to expose the underlying dura and nerve roots, and generous foraminotomies were performed to adequately decompress the neural elements. Both the exiting and traversing nerve roots were decompressed on both sides until a coronary dilator passed easily along the nerve roots. Once the decompression was complete, I turned my attention to the posterior lower lumbar interbody fusion. The epidural venous vasculature was coagulated and cut sharply. Disc space was incised and the initial discectomy was performed with pituitary rongeurs. The disc space was distracted with sequential distractors to a height of 8 mm. We then used a series of scrapers and shavers to prepare the endplates for fusion. The midline was prepared with Epstein curettes. Once the complete discectomy was finished, we packed an appropriate sized peek interbody cage with local autograft and morcellized allograft, gently retracted the nerve root, and tapped the cage into position at L4-5.  The midline between the cages was packed with morselized autograft and allograft. We then turned our attention to the placement of the lower pedicle screws. The pedicle screw entry zones were identified  utilizing surface landmarks and fluoroscopy. I drilled into each pedicle utilizing the hand drill and EMG monitoring, and tapped each pedicle with the appropriate tap. We palpated with a ball probe to assure no break in the cortex. We then  placed 5-0 x 35 mm pedicle screws into the pedicles bilaterally at L5. We then decorticated the transverse processes and laid a mixture of morcellized autograft and allograft out over these to perform intertransverse arthrodesis at L1-L5 on the right. We then placed lordotic rods into the multiaxial screw heads of the pedicle screws and locked these in position with the locking caps and anti-torque device. We then checked our construct with AP and lateral fluoroscopy. Irrigated with copious amounts of bacitracin-containing saline solution. Placed a medium Hemovac drain through separate stab incision. Inspected the nerve roots once again to assure adequate decompression, lined to the dura with Gelfoam, and closed the muscle and the fascia with 0 Vicryl. Closed the subcutaneous tissues with 2-0 Vicryl and subcuticular tissues with 3-0 Vicryl. The skin was closed with benzoin and Steri-Strips. Dressing was then applied, the patient was awakened from general anesthesia and transported to the recovery room in stable condition. At the end of the procedure all sponge, needle and instrument counts were correct.         PLAN OF CARE: Admit to inpatient   PATIENT DISPOSITION:  PACU - hemodynamically stable.   Delay start of Pharmacological VTE agent (>24hrs) due to surgical blood loss or risk of bleeding:  yes

## 2013-11-13 NOTE — OR Nursing (Signed)
First portion of procedure (ALIF) ended at 1245. Room turnover and patient positioning took place and second portion of procedure (Cortical screws, lumbar fusion) began at 1325.

## 2013-11-14 ENCOUNTER — Inpatient Hospital Stay (HOSPITAL_COMMUNITY): Payer: BC Managed Care – PPO

## 2013-11-14 LAB — CBC
HCT: 34.1 % — ABNORMAL LOW (ref 36.0–46.0)
Hemoglobin: 11 g/dL — ABNORMAL LOW (ref 12.0–15.0)
MCH: 29.9 pg (ref 26.0–34.0)
MCHC: 32.3 g/dL (ref 30.0–36.0)
MCV: 92.7 fL (ref 78.0–100.0)
Platelets: 306 10*3/uL (ref 150–400)
RBC: 3.68 MIL/uL — ABNORMAL LOW (ref 3.87–5.11)
RDW: 13.9 % (ref 11.5–15.5)
WBC: 21.8 10*3/uL — ABNORMAL HIGH (ref 4.0–10.5)

## 2013-11-14 MED ORDER — PROMETHAZINE HCL 25 MG/ML IJ SOLN
12.5000 mg | Freq: Four times a day (QID) | INTRAMUSCULAR | Status: DC | PRN
  Administered 2013-11-14 – 2013-11-15 (×5): 12.5 mg via INTRAVENOUS
  Filled 2013-11-14 (×5): qty 1

## 2013-11-14 MED ORDER — SIMVASTATIN 80 MG PO TABS
80.0000 mg | ORAL_TABLET | Freq: Every day | ORAL | Status: DC
  Administered 2013-11-15: 80 mg via ORAL
  Filled 2013-11-14 (×2): qty 1

## 2013-11-14 MED ORDER — ZOLPIDEM TARTRATE 5 MG PO TABS
5.0000 mg | ORAL_TABLET | Freq: Every evening | ORAL | Status: DC | PRN
  Administered 2013-11-14 – 2013-11-15 (×2): 5 mg via ORAL
  Filled 2013-11-14 (×2): qty 1

## 2013-11-14 MED ORDER — HYDROMORPHONE HCL PF 1 MG/ML IJ SOLN
1.5000 mg | INTRAMUSCULAR | Status: DC | PRN
  Administered 2013-11-14 – 2013-11-15 (×6): 1.5 mg via INTRAMUSCULAR
  Filled 2013-11-14 (×6): qty 2

## 2013-11-14 MED ORDER — KETOROLAC TROMETHAMINE 30 MG/ML IJ SOLN: 30.0000 mg | Freq: Four times a day (QID) | INTRAMUSCULAR | Status: AC | PRN

## 2013-11-14 NOTE — Progress Notes (Signed)
Pt verbalized relief of pain rated 3/10 on pain scale no nausea at present Ambien 5 mg po given for sleep  Reminded to put on brace when OOB  as pt was seen assisted to the bathroom by spouse without back brace on . Pt demonstrated good understanding. Carin Hock RN

## 2013-11-14 NOTE — Progress Notes (Signed)
Patient ID: [name], female   DOB: [birth year], 68 y.o.   MRN: [id number] POD1 Afeb, vss No new neuro issues C/o a lot of incisional pain. Will change her meds away from PCA as she says that makes her sick

## 2013-11-14 NOTE — Progress Notes (Signed)
Occupational Therapy Evaluation Patient Details Name: [name] MRN: [id number] DOB: 07-05-1945 Today's Date: 11/14/2013    History of Present Illness Pt is 68 y.o. Female s/p lumbar spinal fusion on 11/14/13.   Clinical Impression   PTA pt lived at home and was independent with ADLs and functional mobility. Pt was limited by pain and nausea this date and could only tolerate sitting in the recliner for about 10 minutes. Pt would continue to benefit from skilled OT to promote independence with ADLs prior to d/c home.     Follow Up Recommendations  No OT follow up;Supervision/Assistance - 24 hour    Equipment Recommendations  None recommended by OT       Precautions / Restrictions Precautions Precautions: Back;Fall Precaution Booklet Issued: Yes (comment) Precaution Comments: Educated pt on 3/3 back precautions and incorporating into ADLs.  Required Braces or Orthoses: Spinal Brace Spinal Brace: Lumbar corset;Applied in sitting position Restrictions Weight Bearing Restrictions: No      Mobility Bed Mobility Overal bed mobility: Needs Assistance Bed Mobility: Rolling;Sidelying to Sit;Sit to Sidelying Rolling: Min assist Sidelying to sit: Min assist     Sit to sidelying: Min assist General bed mobility comments: HOB flat, bed rails down  Transfers Overall transfer level: Needs assistance Equipment used: 1 person hand held assist Transfers: Sit to/from [facility] Sit to Stand: Min assist Stand pivot transfers: Min assist            Balance Overall balance assessment: Needs assistance Sitting-balance support: No upper extremity supported;Feet supported Sitting balance-Leahy Scale: Fair     Standing balance support: Bilateral upper extremity supported;During functional activity Standing balance-Leahy Scale: Poor                              ADL Overall ADL's : Needs assistance/impaired Eating/Feeding: Independent;Sitting    Grooming: Set up;Sitting   Upper Body Bathing: Set up;Sitting   Lower Body Bathing: Minimal assistance;Sit to/from stand   Upper Body Dressing : Minimal assistance;Sitting (including back brace)   Lower Body Dressing: Moderate assistance;Sit to/from stand   Toilet Transfer: Minimal assistance;Stand-pivot (from bed>recliner with 1 person hand held assist)   Toileting- Clothing Manipulation and Hygiene: Moderate assistance;Sit to/from stand;Adhering to back precautions   Tub/ Shower Transfer: Total assistance   Functional mobility during ADLs:  (pt unable to ambulate due to nausea and dizziness) General ADL Comments: Pt limited by pain and nausea this date, however able to transfer from bed to recliner. Pt reports this is her first time sitting up today. Educated pt and husband on compensatory techniques for LB ADLs, energy conservation, and environmental modifications. Also educated pt on fall prevention and safety with DME at home. Pt tolerated sitting upright in recliner for about 10 minutes then stated it was "unbearable" and transferred back to bed.      Vision  Per pt report, no change from baseline.  No apparent visual deficits.                 Perception Perception Perception Tested?: No   Praxis Praxis Praxis tested?: Within functional limits    Pertinent Vitals/Pain Pt reports pain when sitting as "unbearable" and pt was returned to bed and positioned for comfort with reported relief. Notified RN.      Hand Dominance Right   Extremity/Trunk Assessment Upper Extremity Assessment Upper Extremity Assessment: Overall WFL for tasks assessed   Lower Extremity Assessment Lower Extremity Assessment: Defer to  PT evaluation   Cervical / Trunk Assessment Cervical / Trunk Assessment: Normal   Communication Communication Communication: No difficulties   Cognition Arousal/Alertness: Lethargic;Suspect due to medications Behavior During Therapy: [facility] for tasks  assessed/performed Overall Cognitive Status: Within Functional Limits for tasks assessed                                Home Living Family/patient expects to be discharged to:: Private residence Living Arrangements: Spouse/significant other Available Help at Discharge: Family;Available 24 hours/day Type of Home: House Home Access: Stairs to enter [employer] of Steps: 4 Entrance Stairs-Rails: Right Home Layout: One level     Bathroom Shower/Tub: Tub/shower unit Shower/tub characteristics: Curtain       Home Equipment: None          Prior Functioning/Environment Level of Independence: Independent             OT Diagnosis: Generalized weakness;Acute pain   OT Problem List: Decreased strength;Decreased range of motion;Decreased activity tolerance;Impaired balance (sitting and/or standing);Decreased safety awareness;Decreased knowledge of use of DME or AE;Decreased knowledge of precautions;Pain   OT Treatment/Interventions: Self-care/ADL training;Therapeutic exercise;Energy conservation;DME and/or AE instruction;Therapeutic activities;Patient/family education;Balance training    OT Goals(Current goals can be found in the care plan section) Acute Rehab OT Goals Patient Stated Goal: to go home OT Goal Formulation: With patient Time For Goal Achievement: 11/21/13 Potential to Achieve Goals: Good ADL Goals Pt Will Perform Grooming: with supervision;standing Pt Will Perform Lower Body Bathing: with supervision;with adaptive equipment;sit to/from stand;with set-up Pt Will Perform Lower Body Dressing: with set-up;with supervision;with adaptive equipment;sit to/from stand Pt Will Transfer to Toilet: with supervision;ambulating;bedside commode Pt Will Perform Toileting - Clothing Manipulation and hygiene: with supervision;sit to/from stand;with adaptive equipment;with set-up Pt Will Perform Tub/Shower Transfer: Tub transfer;with supervision;ambulating;3 in  1;rolling walker Additional ADL Goal #1: Pt will perform bed mobility using log roll technique with Supervision to prepare for ADLs  OT Frequency: Min 2X/week    End of Session Equipment Utilized During Treatment: Gait belt;Back brace Activity Tolerance: Patient limited by fatigue;Patient limited by pain Patient left: in bed;with call bell/phone within reach;with bed alarm set;with family/visitor present   Time: 9169-4503 OT Time Calculation (min): 32 min Charges:  OT General Charges $OT Visit: 1 Procedure OT Evaluation $Initial OT Evaluation Tier I: 1 Procedure OT Treatments $Self Care/Home Management : 8-22 mins  Juluis Rainier [phone] 11/14/2013, 4:03 PM

## 2013-11-14 NOTE — Progress Notes (Signed)
Pt dilaudid pca dc. 19.36ml wasted with Francisca RN in sink. New orders received for pt. Will continue to monitor pt quietly. Spouse continue to remain at bedside. Francis Gaines Cutberto Winfree RN.

## 2013-11-14 NOTE — Progress Notes (Signed)
Pt oob with x1 assist to [facility]; voided x1, pt reported nausea and pt daughter at bedside said phenergan works better for mom. Dr. Annette Stable called and new order received for phenergan. Will administer and continue to monitor pt quietly. Francis Gaines Jet Armbrust RN.

## 2013-11-14 NOTE — Evaluation (Signed)
Physical Therapy Evaluation Patient Details Name: [name] MRN: [id number] DOB: 08/14/1945 Today's Date: 11/14/2013   History of Present Illness  68 y.o. female admitted to [facility] on 11/13/13 s/p elective L1-5 decompression and fusion.  She has significant PMHx of previous lumbar decompression/laminectomy in 2014, HTN, R achilles tendon repair, bil carpal tunnel release.   Clinical Impression  Pt is POD #1 s/p multi level lumbar fusion surgery.  She is moving well this afternoon, but is limited in her ability to sit up OOB due to pain.  She has also had quite a bit of nausea today.  I anticipate that she will be able to go home safely with her husband's assist at d/c.   PT to follow acutely for deficits listed below.       Follow Up Recommendations No PT follow up;Supervision for mobility/OOB    Equipment Recommendations  None recommended by PT (as pt reports she cannot fit a RW around her home)    Recommendations for Other Services   NA    Precautions / Restrictions Precautions Precautions: Back;Fall Precaution Booklet Issued: Yes (comment) Precaution Comments: Educated pt on 3/3 back precautions and incorporating into ADLs.  Required Braces or Orthoses: Spinal Brace Spinal Brace: Lumbar corset;Applied in sitting position Restrictions Weight Bearing Restrictions: No      Mobility  Bed Mobility Overal bed mobility: Needs Assistance Bed Mobility: Rolling;Sidelying to Sit;Sit to Sidelying Rolling: Supervision Sidelying to sit: Min assist     Sit to sidelying: Min assist General bed mobility comments: Min assist to support trunk to get to sitting EOB, min assist of one leg to get back into the bed.  Reinforced log roll technique and pt with heavy reliance on arms to get to side and seated EOB.  No railing to get back into bed  Transfers Overall transfer level: Needs assistance Equipment used: 2 person hand held assist;Rolling walker (2 wheeled) Transfers: Sit to/from  Stand Sit to Stand: Min guard;+2 safety/equipment Stand pivot transfers: Min assist       General transfer comment: At first used two person hand held assist of therapist and husband, but progressed to using RW and pt was able to do so with just min guard assist off of toilet and bed.   Ambulation/Gait Ambulation/Gait assistance: +2 physical assistance;Min assist;Min guard Ambulation Distance (Feet): 120 Feet Assistive device: Rolling walker (2 wheeled);2 person hand held assist Gait Pattern/deviations: Step-through pattern;Shuffle;Leaning posteriorly     General Gait Details: Started with two person hand held assist, but switched to RW to make pt more independent half way through gait.  Pt looks much better with RW use, but husband reports he doesn't think they have room for a RW to go around the house.  He is willing to provide hand held assist as needed at home.          Balance Overall balance assessment: Needs assistance Sitting-balance support: Feet supported Sitting balance-Leahy Scale: Good     Standing balance support: Bilateral upper extremity supported Standing balance-Leahy Scale: Poor                               Pertinent Vitals/Pain See vitals flow sheet.     Home Living Family/patient expects to be discharged to:: Private residence Living Arrangements: Spouse/significant other Available Help at Discharge: Family;Available 24 hours/day Type of Home: House Home Access: Stairs to enter Entrance Stairs-Rails: Right Entrance Stairs-Number of Steps: 4 Home Layout:  One level Home Equipment: None      Prior Function Level of Independence: Independent               Hand Dominance   Dominant Hand: Right    Extremity/Trunk Assessment   Upper Extremity Assessment: Defer to OT evaluation           Lower Extremity Assessment: Generalized weakness (no functional asymmetries noted. )      Cervical / Trunk Assessment: Other  exceptions  Communication   Communication: No difficulties  Cognition Arousal/Alertness: Awake/alert Behavior During Therapy: WFL for tasks assessed/performed Overall Cognitive Status: Within Functional Limits for tasks assessed                               Assessment/Plan    PT Assessment Patient needs continued PT services  PT Diagnosis Difficulty walking;Abnormality of gait;Generalized weakness;Acute pain   PT Problem List Decreased strength;Decreased activity tolerance;Decreased balance;Decreased mobility;Decreased knowledge of use of DME;Decreased knowledge of precautions;Pain  PT Treatment Interventions DME instruction;Gait training;Functional mobility training;Therapeutic activities;Therapeutic exercise;Balance training;Neuromuscular re-education;Cognitive remediation;Patient/family education;Modalities;Stair training   PT Goals (Current goals can be found in the Care Plan section) Acute Rehab PT Goals Patient Stated Goal: to go home PT Goal Formulation: With patient/family Time For Goal Achievement: 11/21/13 Potential to Achieve Goals: Good    Frequency Min 5X/week    End of Session Equipment Utilized During Treatment: Back brace Activity Tolerance: Patient limited by pain Patient left: in bed;with call bell/phone within reach;with family/visitor present           Time: 1027-2536 PT Time Calculation (min): 28 min   Charges:   PT Evaluation $Initial PT Evaluation Tier I: 1 Procedure PT Treatments $Gait Training: 8-22 mins        Starasia Sinko B. [place], [place], DPT [phone]   11/14/2013, 4:32 PM

## 2013-11-14 NOTE — Progress Notes (Signed)
1028 pt reports feeling nauseated after receiving her IM prn but states "I've been able to doze off a little". MD called and Dr. Annette Stable ordered to go ahead and give her prn zofran x1. Med given and pt in bed comfortably with spouse at side. Will continue to monitor. Francis Gaines Uriyah Massimo RN.

## 2013-11-14 NOTE — Plan of Care (Signed)
Problem: Consults Goal: Diagnosis - Spinal Surgery Outcome: Completed/Met Date Met:  11/14/13 Lumbar fusion

## 2013-11-14 NOTE — Progress Notes (Signed)
PT Cancellation Note  Patient Details Name: [name] MRN: [id number] DOB: 1945/09/20   Cancelled Treatment:    Reason Eval/Treat Not Completed: Other (comment) (pt nauseated).  Pt asked if PT/OT could check back after lunch.   Thanks,    Barbarann Ehlers. [place], [place], DPT [phone]   11/14/2013, 11:20 AM

## 2013-11-15 MED ORDER — PROMETHAZINE HCL 25 MG PO TABS
12.5000 mg | ORAL_TABLET | Freq: Four times a day (QID) | ORAL | Status: DC | PRN
  Administered 2013-11-16 (×2): 12.5 mg via ORAL
  Filled 2013-11-15 (×2): qty 1

## 2013-11-15 MED ORDER — HYDROMORPHONE HCL 2 MG PO TABS
4.0000 mg | ORAL_TABLET | ORAL | Status: DC | PRN
  Administered 2013-11-16 (×2): 4 mg via ORAL
  Filled 2013-11-15 (×2): qty 2

## 2013-11-15 NOTE — Progress Notes (Signed)
Doing better today. Able to ambulate in the hallway with minimal assistance. Back and leg pain improving.  Afebrile. Vitals are stable. Urine output good. Drain output low. Awake and alert. Oriented and appropriate. Motor and sensory function intact. Dressings clean and dry. Chest and abdomen benign.  Progressing well following anterior posterior lumbar decompression and fusion. Remove drain. Continue efforts at mobilization. Possible discharge tomorrow.

## 2013-11-15 NOTE — Progress Notes (Signed)
Patient ambulated down the entire hallway with 1 assist, brace and walker. Tolerated very well. Back to chair with call bell in reach

## 2013-11-15 NOTE — Progress Notes (Signed)
hemovac DC'd as per order

## 2013-11-15 NOTE — Progress Notes (Signed)
Physical Therapy Treatment Patient Details Name: [name] MRN: [id number] DOB: 06-03-1946 Today's Date: 11/15/2013    History of Present Illness 68 y.o. female admitted to [facility] on 11/13/13 s/p elective L1-5 decompression and fusion.  She has significant PMHx of previous lumbar decompression/laminectomy in 2014, HTN, R achilles tendon repair, bil carpal tunnel release.     PT Comments    Pt progressing with mobility, ambulated 300' with RW and supervision as well as up/down 5 steps with min-guard A. Pt continues to experience numbness bilateral LE's in certain positions as well as pain down legs with ambulation (began on right, then left with stairs). Updated discharge rec to HHPT and RW for safety with ambulation given continuation of radicular symptoms. PT will continue to follow.   Follow Up Recommendations  Home health PT;Supervision for mobility/OOB     Equipment Recommendations  Rolling walker with 5" wheels    Recommendations for Other Services       Precautions / Restrictions Precautions Precautions: Back;Fall Precaution Booklet Issued: No Precaution Comments: pt recalled 1/3 precautions, reviewed all as well as posture throughout mobility Required Braces or Orthoses: Spinal Brace Spinal Brace: Lumbar corset;Applied in sitting position Restrictions Weight Bearing Restrictions: No    Mobility  Bed Mobility               General bed mobility comments: pt up in chair  Transfers Overall transfer level: Needs assistance Equipment used: Rolling walker (2 wheeled) Transfers: Sit to/from Stand Sit to Stand: Min guard         General transfer comment: vc's to remind of safe hand placement, slow cautious mvmt but no physical assist needed  Ambulation/Gait Ambulation/Gait assistance: Supervision Ambulation Distance (Feet): 300 Feet Assistive device: Rolling walker (2 wheeled) Gait Pattern/deviations: Decreased stance time - left;Decreased weight shift to  left;Decreased stride length Gait velocity: decreased Gait velocity interpretation: Below normal speed for age/gender General Gait Details: continued with RW, vc's for posture while using it. Due to continued pain down legs and antalgia, recommend to pt that she use RW consistently with ambulation. She and husband in agreement and report they can make a path wide enough through house for use of RW   Stairs Stairs: Yes Stairs assistance: Min guard Stair Management: Step to pattern;One rail Left;Forwards Number of Stairs: 5 General stair comments: education for sequencing according to painful leg (changes at different times)  Wheelchair Mobility    Modified Rankin (Stroke Patients Only)       Balance Overall balance assessment: Needs assistance Sitting-balance support: No upper extremity supported;Feet supported Sitting balance-Leahy Scale: Good     Standing balance support: No upper extremity supported;During functional activity Standing balance-Leahy Scale: Poor Standing balance comment: pt very insecure without UE in standing, requires at least min HHA if without RW                    Cognition Arousal/Alertness: Awake/alert Behavior During Therapy: WFL for tasks assessed/performed Overall Cognitive Status: Within Functional Limits for tasks assessed       Memory: Decreased recall of precautions              Exercises      General Comments        Pertinent Vitals/Pain 7/10 pain bilateral legs with ambulation, down to 2/10 with rest    Home Living                      Prior Function  PT Goals (current goals can now be found in the care plan section) Acute Rehab PT Goals Patient Stated Goal: to go home PT Goal Formulation: With patient/family Time For Goal Achievement: 11/21/13 Potential to Achieve Goals: Good Progress towards PT goals: Progressing toward goals    Frequency  Min 5X/week    PT Plan Discharge plan needs  to be updated    Co-evaluation             End of Session Equipment Utilized During Treatment: Back brace Activity Tolerance: Patient tolerated treatment well Patient left: in chair;with call bell/phone within reach;with family/visitor present     Time: 0927-0957 PT Time Calculation (min): 30 min  Charges:  $Gait Training: 23-37 mins                    G Codes:     [place]  [phone]  Leighton Roach 11/15/2013, 10:08 AM

## 2013-11-16 ENCOUNTER — Encounter (HOSPITAL_COMMUNITY): Payer: Self-pay | Admitting: Neurological Surgery

## 2013-11-16 MED ORDER — PROMETHAZINE HCL 12.5 MG PO TABS: 12.5000 mg | ORAL_TABLET | Freq: Four times a day (QID) | ORAL | Status: DC | PRN

## 2013-11-16 MED ORDER — DIAZEPAM 5 MG PO TABS: 5.0000 mg | ORAL_TABLET | Freq: Three times a day (TID) | ORAL | Status: DC | PRN

## 2013-11-16 MED ORDER — HYDROMORPHONE HCL 4 MG PO TABS: 4.0000 mg | ORAL_TABLET | ORAL | Status: DC | PRN

## 2013-11-16 NOTE — Progress Notes (Signed)
Pt A&O x4; pt discharge education and instructions completed with pt and spouse at bedside. Both voices understanding and denies any questions. Pt IV removed; pt handed her prescription for dilaudid and valium. MD office called concerning pt phenergan prescription and MD to call it in to [facility] [place] per MD secretary. Pt infomed and agree. Pt home equipments delivered to pt room. Pt pre-medicated before discharge upon request. Pt transported off unit via wheelchair with spouse and belongings at side. Pt discharge home and spouse to transport pt to disposition. Francis Gaines Owain Eckerman RN.

## 2013-11-16 NOTE — Progress Notes (Signed)
Physical Therapy Treatment Patient Details Name: [name] MRN: [id number] DOB: 1945/11/08 Today's Date: 11/16/2013    History of Present Illness 68 y.o. female admitted to [facility] on 11/13/13 s/p elective L1-5 decompression and fusion.  She has significant PMHx of previous lumbar decompression/laminectomy in 2014, HTN, R achilles tendon repair, bil carpal tunnel release.     PT Comments    Educated pt on using step stool to transfer in/out of bed due to higher surface height. Pt with good recall and compliance of back precautions. Pt safe to d/c home once medically stable with use of RW and assist of spouse.  Follow Up Recommendations  Home health PT;Supervision for mobility/OOB     Equipment Recommendations  Rolling walker with 5" wheels    Recommendations for Other Services       Precautions / Restrictions Precautions Precautions: Back;Fall Precaution Booklet Issued: No Precaution Comments: Pt able to state 3/3 back precautions Required Braces or Orthoses: Spinal Brace Spinal Brace: Lumbar corset;Applied in sitting position Restrictions Weight Bearing Restrictions: No    Mobility  Bed Mobility Overal bed mobility: Modified Independent Bed Mobility: Rolling;Sidelying to Sit;Sit to Sidelying Rolling: Modified independent (Device/Increase time) Sidelying to sit: Modified independent (Device/Increase time)     Sit to sidelying: Modified independent (Device/Increase time) General bed mobility comments: used bed rail, reminded pt she would not have that at home  Transfers Overall transfer level: Needs assistance Equipment used: Rolling walker (2 wheeled) Transfers: Sit to/from Stand Sit to Stand: Supervision         General transfer comment: increased time, good technique  Ambulation/Gait Ambulation/Gait assistance: Supervision Ambulation Distance (Feet): 200 Feet Assistive device: Rolling walker (2 wheeled) Gait Pattern/deviations: WFL(Within Functional  Limits)   Gait velocity interpretation: Below normal speed for age/gender (s/p surgery) General Gait Details: pt with good walker management, steady, no episodes of LOB   Stairs Stairs: Yes Stairs assistance: Min guard Stair Management: Backwards;With walker Number of Stairs: 1 General stair comments: stimulated using step to get into bed of higher surface height  Wheelchair Mobility    Modified Rankin (Stroke Patients Only)       Balance                                    Cognition Arousal/Alertness: Awake/alert Behavior During Therapy: WFL for tasks assessed/performed Overall Cognitive Status: Within Functional Limits for tasks assessed                      Exercises      General Comments        Pertinent Vitals/Pain Didn't report    Home Living                      Prior Function            PT Goals (current goals can now be found in the care plan section) Acute Rehab PT Goals Patient Stated Goal: not stated Progress towards PT goals: Progressing toward goals    Frequency  Min 3X/week    PT Plan Discharge plan needs to be updated    Co-evaluation             End of Session Equipment Utilized During Treatment: Back brace Activity Tolerance: Patient tolerated treatment well Patient left: in chair;with call bell/phone within reach;with family/visitor present     Time: 7829-5621 PT Time Calculation (min):  14 min  Charges:  $Gait Training: 8-22 mins                    G Codes:      Kingsley Callander 11/16/2013, 1:30 PM  Kittie Plater, PT, DPT Pager #: [phone] Office #: [phone]

## 2013-11-16 NOTE — Progress Notes (Signed)
Occupational Therapy Treatment Patient Details Name: [name] MRN: [id number] DOB: 04/09/1946 Today's Date: 11/16/2013    History of present illness 68 y.o. female admitted to [facility] on 11/13/13 s/p elective L1-5 decompression and fusion.  She has significant PMHx of previous lumbar decompression/laminectomy in 2014, HTN, R achilles tendon repair, bil carpal tunnel release.    OT comments  Pt moving well during session. Education provided to pt and significant other. Feel pt is safe to d/c home from OT standpoint.  Follow Up Recommendations  No OT follow up;Supervision/Assistance - 24 hour    Equipment Recommendations  None recommended by OT    Recommendations for Other Services      Precautions / Restrictions Precautions Precautions: Back;Fall Precaution Booklet Issued: No Precaution Comments: Pt able to state 3/3 back precautions Required Braces or Orthoses: Spinal Brace Spinal Brace: Lumbar corset;Applied in sitting position Restrictions Weight Bearing Restrictions: No       Mobility Bed Mobility Overal bed mobility: Needs Assistance Bed Mobility: Rolling;Sidelying to Sit;Sit to Sidelying Rolling: Supervision Sidelying to sit: Supervision     Sit to sidelying: Supervision    Transfers Overall transfer level: Needs assistance Equipment used: Rolling walker (2 wheeled) Transfers: Sit to/from Stand Sit to Stand: Supervision         General transfer comment: cues for hand placement/technique    Balance                                   ADL Overall ADL's : Needs assistance/impaired                     Lower Body Dressing: Set up;Supervision/safety;Sitting/lateral leans (socks/shoes)   Toilet Transfer: Supervision/safety;Ambulation;Comfort height toilet;Regular Toilet;RW       Tub/ Shower Transfer: Agricultural engineer;Shower seat   Functional mobility during ADLs: Supervision/safety;Min guard;Rolling walker (Min guard  for tub transfer) General ADL Comments: Educated on use of cup for teeth care and placement of grooming items to avoid breaking precautions. Educated on use of bag on walker, discussed rugs, and safe shoewear. Educated that AE is available for LB ADLs-pt able to cross legs for LB dressing-cues for precautions. Recommended sitting for LB ADLs. Recommended someone be with pt for tub transfer and while bathing. Educated on back brace (clothing underneath and not to sleep in brace). Educated on toilet aid for hygiene as pt states she twists. Discussed using lawn chair/fold down chair for tub.      Vision                     Perception     Praxis      Cognition   Behavior During Therapy: [facility] for tasks assessed/performed Overall Cognitive Status: Within Functional Limits for tasks assessed                       Extremity/Trunk Assessment               Exercises     Shoulder Instructions       General Comments      Pertinent Vitals/ Pain       Pain 2/10. Repositioned.   Home Living  Prior Functioning/Environment              Frequency Min 2X/week     Progress Toward Goals  OT Goals(current goals can now be found in the care plan section)  Progress towards OT goals: Progressing toward goals  Acute Rehab OT Goals Patient Stated Goal: not stated OT Goal Formulation: With patient Time For Goal Achievement: 11/21/13 Potential to Achieve Goals: Good ADL Goals Pt Will Perform Grooming: with supervision;standing Pt Will Perform Lower Body Bathing: with supervision;with adaptive equipment;sit to/from stand;with set-up Pt Will Perform Lower Body Dressing: with set-up;with supervision;with adaptive equipment;sit to/from stand Pt Will Transfer to Toilet: with supervision;ambulating;bedside commode Pt Will Perform Toileting - Clothing Manipulation and hygiene: with supervision;sit to/from stand;with  adaptive equipment;with set-up Pt Will Perform Tub/Shower Transfer: Tub transfer;with supervision;ambulating;3 in 1;rolling walker Additional ADL Goal #1: Pt will perform bed mobility using log roll technique with Supervision to prepare for ADLs  Plan Discharge plan remains appropriate    Co-evaluation                 End of Session Equipment Utilized During Treatment: Gait belt;Rolling walker;Back brace   Activity Tolerance Patient tolerated treatment well   Patient Left in bed;with family/visitor present;with call bell/phone within reach   Nurse Communication          Time: 2330-0762 OT Time Calculation (min): 35 min  Charges: OT General Charges $OT Visit: 1 Procedure OT Treatments $Self Care/Home Management : 23-37 mins  Benito Mccreedy OTR/L [phone] 11/16/2013, 9:40 AM

## 2013-11-16 NOTE — Care Management Note (Signed)
    Page 1 of 1   11/16/2013     11:09:19 AM CARE MANAGEMENT NOTE 11/16/2013  Patient:  [name], [name]   Account Number:  [id number]  Date Initiated:  11/16/2013  Documentation initiated by:  Lorne Skeens  Subjective/Objective Assessment:   Patient was admitted with spondylosis. Lives at home with spouse     Action/Plan:   Will follow for discharge needs pending PT/OT evals and physician orders.   Anticipated DC Date:  11/16/2013   Anticipated DC Plan:  [place]  CM consult      Choice offered to / List presented to:     DME arranged  3-N-1  Vassie Moselle      DME agency  [place].        Status of service:  Completed, signed off Medicare Important Message given?   (If response is "NO", the following Medicare IM given date fields will be blank) Date Medicare IM given:   Date Additional Medicare IM given:    Discharge Disposition:  HOME/SELF CARE  Per UR Regulation:  Reviewed for med. necessity/level of care/duration of stay  If discussed at [place] of Stay Meetings, dates discussed:    Comments:  11/16/13 [place], MSN, CM- [facility] DME was notified of need for walker and 3n1 for discharge home today.  Patient's RN spoke with Dr Ronnald Ramp, who does not feel HH therapies are needed at discharge.

## 2013-11-16 NOTE — Discharge Summary (Signed)
Physician Discharge Summary  Patient ID: [name] MRN: [id number] DOB/AGE: 1946-03-26 68 y.o.  Admit date: 11/13/2013 Discharge date: 11/16/2013  Admission Diagnoses: lumbar stenosis and instability, with deformity   Discharge Diagnoses: same   Discharged Condition: good  Hospital Course: The patient was admitted on 11/13/2013 and taken to the operating room where the patient underwent multilevel lumbar decompression and instrumented fusion. The patient tolerated the procedure well and was taken to the recovery room and then to the floor in stable condition. The hospital course was routine. There were no complications. The wound remained clean dry and intact. Pt had appropriate back soreness. No complaints of leg pain or new N/T/W. The patient remained afebrile with stable vital signs, and tolerated a regular diet. The patient continued to increase activities, and pain was well controlled with oral pain medications.   Consults: None  Significant Diagnostic Studies:  Results for orders placed during the hospital encounter of 11/13/13  CBC      Result Value Ref Range   WBC 17.6 (*) 4.0 - 10.5 K/uL   RBC 3.95  3.87 - 5.11 MIL/uL   Hemoglobin 11.9 (*) 12.0 - 15.0 g/dL   HCT 36.2  36.0 - 46.0 %   MCV 91.6  78.0 - 100.0 fL   MCH 30.1  26.0 - 34.0 pg   MCHC 32.9  30.0 - 36.0 g/dL   RDW 13.4  11.5 - 15.5 %   Platelets 268  150 - 400 K/uL  D-DIMER, QUANTITATIVE      Result Value Ref Range   D-Dimer, Quant 2.53 (*) 0.00 - 0.48 ug/mL-FEU  CBC      Result Value Ref Range   WBC 21.8 (*) 4.0 - 10.5 K/uL   RBC 3.68 (*) 3.87 - 5.11 MIL/uL   Hemoglobin 11.0 (*) 12.0 - 15.0 g/dL   HCT 34.1 (*) 36.0 - 46.0 %   MCV 92.7  78.0 - 100.0 fL   MCH 29.9  26.0 - 34.0 pg   MCHC 32.3  30.0 - 36.0 g/dL   RDW 13.9  11.5 - 15.5 %   Platelets 306  150 - 400 K/uL    Chest 2 View  11/06/2013   CLINICAL DATA:  Preoperative evaluation.  EXAM: CHEST  2 VIEW  COMPARISON:  Chest x-ray 06/30/2012.   FINDINGS: Lung volumes are normal. No consolidative airspace disease. No pleural effusions. No pneumothorax. No pulmonary nodule or mass noted. Pulmonary vasculature and the cardiomediastinal silhouette are within normal limits. Atherosclerosis in the thoracic aorta.  IMPRESSION: 1.  No radiographic evidence of acute cardiopulmonary disease. 2. Atherosclerosis.   Electronically Signed   By: Vinnie Langton M.D.   On: 11/06/2013 14:12   Dg Lumbar Spine 2-3 Views  11/14/2013   CLINICAL DATA:  Lumbar fusion.  EXAM: LUMBAR SPINE - 2-3 VIEW  COMPARISON:  11/13/2013.  FINDINGS: Patient has undergone prior posterior and interbody fusion L1 through L5. No acute bony abnormalities identified. Good anatomic alignment. Diffuse degenerative change. Surgical hardware intact. Aortoiliac atherosclerotic vascular disease.  IMPRESSION: Patient has undergone prior posterior and interbody fusion L1 through L5 with good anatomic alignment. No acute abnormality.   Electronically Signed   By: Marcello Moores  Register   On: 11/14/2013 18:23   Dg Lumbar Spine Complete  11/13/2013   CLINICAL DATA:  PLIF L2-3, L3-4, L4-5  EXAM: LUMBAR SPINE - COMPLETE 4+ VIEW; DG C-ARM GT 120 MIN  COMPARISON:  10/07/2013, CT myelogram  FINDINGS: Multiple images are performed, showing intraoperative localization  of the L3 level follow-up by a posterior fusion of L1-L5 with interbody fusion devices at L1-2, L2-3, L3-4, L4-5.  IMPRESSION: Posterior fusion of the lumbar spine.   Electronically Signed   By: Shon Hale M.D.   On: 11/13/2013 16:45   Dg Chest Port 1 View  11/13/2013   CLINICAL DATA:  Chest pain, status post lumbar surgery  EXAM: PORTABLE CHEST - 1 VIEW  COMPARISON:  None.  FINDINGS: Mild patchy opacity in the lingula/left lower lobe, likely atelectasis. No focal consolidation. No pleural effusion or pneumothorax.  The heart is normal in size.  IMPRESSION: Mild patchy opacity in the lingula/left lower lobe, likely atelectasis.   Electronically  Signed   By: Julian Hy M.D.   On: 11/13/2013 23:22   Dg C-arm Gt 120 Min  11/13/2013   CLINICAL DATA:  PLIF L2-3, L3-4, L4-5  EXAM: LUMBAR SPINE - COMPLETE 4+ VIEW; DG C-ARM GT 120 MIN  COMPARISON:  10/07/2013, CT myelogram  FINDINGS: Multiple images are performed, showing intraoperative localization of the L3 level follow-up by a posterior fusion of L1-L5 with interbody fusion devices at L1-2, L2-3, L3-4, L4-5.  IMPRESSION: Posterior fusion of the lumbar spine.   Electronically Signed   By: Shon Hale M.D.   On: 11/13/2013 16:45    Antibiotics:  Anti-infectives   Start     Dose/Rate Route Frequency Ordered Stop   11/13/13 2200  ceFAZolin (ANCEF) IVPB 1 g/50 mL premix     1 g 100 mL/hr over 30 Minutes Intravenous Every 8 hours 11/13/13 2125 11/14/13 0645   11/13/13 1119  bacitracin 50,000 Units in sodium chloride irrigation 0.9 % 500 mL irrigation  Status:  Discontinued       As needed 11/13/13 1119 11/13/13 1701   11/13/13 0600  ceFAZolin (ANCEF) IVPB 2 g/50 mL premix     2 g 100 mL/hr over 30 Minutes Intravenous On call to O.R. 11/12/13 1509 11/13/13 1415      Discharge Exam: Blood pressure 128/76, pulse 90, temperature 97.6 F (36.4 C), temperature source Oral, resp. rate 16, height 5' (1.524 m), weight 63.05 kg (139 lb), SpO2 99.00%. Neurologic: Grossly normal Incision clean dry and intact  Discharge Medications:     Medication List         aspirin EC 81 MG tablet  Take 81 mg by mouth daily. States last dose will be 07/23/12     CALCIUM 600 + D PO  Take 1 tablet by mouth daily.     diazepam 5 MG tablet  Commonly known as:  VALIUM  Take 1 tablet (5 mg total) by mouth every 8 (eight) hours as needed for muscle spasms.     dimenhyDRINATE 50 MG tablet  Commonly known as:  DRAMAMINE  Take 50 mg by mouth at bedtime as needed for nausea.     Fish Oil 1200 MG Caps  Take 1,200 mg by mouth 2 (two) times daily with a meal.     HYDROmorphone 4 MG tablet  Commonly  known as:  DILAUDID  Take 1 tablet (4 mg total) by mouth every 4 (four) hours as needed for severe pain.     lisinopril 5 MG tablet  Commonly known as:  PRINIVIL,ZESTRIL  Take 5 mg by mouth daily after breakfast.     multivitamin with minerals Tabs tablet  Take 1 tablet by mouth daily. States will stop 07/23/12     piroxicam 20 MG capsule  Commonly known as:  FELDENE  Take  20 mg by mouth daily after supper. States last dose 07/23/12     pravastatin 40 MG tablet  Commonly known as:  PRAVACHOL  Take 80 mg by mouth daily after supper.     promethazine 12.5 MG tablet  Commonly known as:  PHENERGAN  Take 1 tablet (12.5 mg total) by mouth every 6 (six) hours as needed for nausea or vomiting.     zolpidem 10 MG tablet  Commonly known as:  AMBIEN  Take 10 mg by mouth at bedtime.        Disposition: home   Final Dx: lumbar decompression and instrumented fusion L1-L5      Discharge Instructions   Call MD for:  difficulty breathing, headache or visual disturbances    Complete by:  As directed      Call MD for:  persistant nausea and vomiting    Complete by:  As directed      Call MD for:  redness, tenderness, or signs of infection (pain, swelling, redness, odor or green/yellow discharge around incision site)    Complete by:  As directed      Call MD for:  severe uncontrolled pain    Complete by:  As directed      Call MD for:  temperature >100.4    Complete by:  As directed      Diet - low sodium heart healthy    Complete by:  As directed      Discharge instructions    Complete by:  As directed   May shower normally, no driving, no heavy lifting, no bending or twisting     Increase activity slowly    Complete by:  As directed      Remove dressing in 48 hours    Complete by:  As directed            Follow-up Information   Follow up with Jariah Tarkowski S, MD In 2 weeks.   Specialty:  Neurosurgery   Contact information:   [address]  [id number] [phone]        Signed: Eustace Moore 11/16/2013, 6:50 AM

## 2014-06-28 DIAGNOSIS — M5416 Radiculopathy, lumbar region: Secondary | ICD-10-CM | POA: Insufficient documentation

## 2015-08-05 DIAGNOSIS — I10 Essential (primary) hypertension: Secondary | ICD-10-CM | POA: Insufficient documentation

## 2015-08-05 DIAGNOSIS — M1990 Unspecified osteoarthritis, unspecified site: Secondary | ICD-10-CM | POA: Insufficient documentation

## 2015-08-05 DIAGNOSIS — E785 Hyperlipidemia, unspecified: Secondary | ICD-10-CM | POA: Insufficient documentation

## 2015-08-05 DIAGNOSIS — F419 Anxiety disorder, unspecified: Secondary | ICD-10-CM | POA: Insufficient documentation

## 2016-02-20 DIAGNOSIS — G2581 Restless legs syndrome: Secondary | ICD-10-CM | POA: Insufficient documentation

## 2017-05-27 DIAGNOSIS — R110 Nausea: Secondary | ICD-10-CM | POA: Insufficient documentation

## 2017-10-16 DIAGNOSIS — R7303 Prediabetes: Secondary | ICD-10-CM | POA: Insufficient documentation

## 2018-04-29 DIAGNOSIS — F5104 Psychophysiologic insomnia: Secondary | ICD-10-CM | POA: Insufficient documentation

## 2018-05-27 ENCOUNTER — Emergency Department (HOSPITAL_COMMUNITY)
Admission: EM | Admit: 2018-05-27 | Discharge: 2018-05-27 | Disposition: A | Discharge status: Home / Self Care | Payer: 59 | Attending: Emergency Medicine | Admitting: Emergency Medicine

## 2018-05-27 ENCOUNTER — Other Ambulatory Visit: Payer: Self-pay

## 2018-05-27 ENCOUNTER — Encounter (HOSPITAL_COMMUNITY): Payer: Self-pay

## 2018-05-27 ENCOUNTER — Emergency Department (HOSPITAL_COMMUNITY): Payer: 59

## 2018-05-27 DIAGNOSIS — Z79899 Other long term (current) drug therapy: Secondary | ICD-10-CM | POA: Insufficient documentation

## 2018-05-27 DIAGNOSIS — F5109 Other insomnia not due to a substance or known physiological condition: Secondary | ICD-10-CM | POA: Insufficient documentation

## 2018-05-27 DIAGNOSIS — Y939 Activity, unspecified: Secondary | ICD-10-CM | POA: Insufficient documentation

## 2018-05-27 DIAGNOSIS — Z7982 Long term (current) use of aspirin: Secondary | ICD-10-CM | POA: Insufficient documentation

## 2018-05-27 DIAGNOSIS — S51811A Laceration without foreign body of right forearm, initial encounter: Secondary | ICD-10-CM | POA: Diagnosis not present

## 2018-05-27 DIAGNOSIS — T07XXXA Unspecified multiple injuries, initial encounter: Secondary | ICD-10-CM | POA: Diagnosis present

## 2018-05-27 DIAGNOSIS — S52614A Nondisplaced fracture of right ulna styloid process, initial encounter for closed fracture: Secondary | ICD-10-CM

## 2018-05-27 DIAGNOSIS — Y92009 Unspecified place in unspecified non-institutional (private) residence as the place of occurrence of the external cause: Secondary | ICD-10-CM | POA: Diagnosis not present

## 2018-05-27 DIAGNOSIS — S0083XA Contusion of other part of head, initial encounter: Secondary | ICD-10-CM

## 2018-05-27 DIAGNOSIS — I10 Essential (primary) hypertension: Secondary | ICD-10-CM | POA: Diagnosis not present

## 2018-05-27 DIAGNOSIS — W19XXXA Unspecified fall, initial encounter: Secondary | ICD-10-CM

## 2018-05-27 DIAGNOSIS — Y999 Unspecified external cause status: Secondary | ICD-10-CM | POA: Insufficient documentation

## 2018-05-27 DIAGNOSIS — W01198A Fall on same level from slipping, tripping and stumbling with subsequent striking against other object, initial encounter: Secondary | ICD-10-CM | POA: Diagnosis not present

## 2018-05-27 NOTE — ED Notes (Signed)
MD Tegeler to bedside

## 2018-05-27 NOTE — ED Provider Notes (Signed)
[facility] EMERGENCY DEPARTMENT Provider Note   CSN: [id number] Arrival date & time: 05/27/18  2005     History   Chief Complaint Chief Complaint  Patient presents with  . Fall    HPI [name] is a 72 y.o. female.  The history is provided by the patient, the spouse, a relative and medical records. No language interpreter was used.  Fall  This is a new problem. The current episode started yesterday. The problem occurs rarely. The problem has been resolved. Pertinent negatives include no chest pain, no abdominal pain, no headaches and no shortness of breath. Nothing aggravates the symptoms. Nothing relieves the symptoms. She has tried nothing for the symptoms. The treatment provided no relief.    Past Medical History:  Diagnosis Date  . Arthritis   . Complication of anesthesia    "slow to wake up"  . GERD (gastroesophageal reflux disease)   . Hyperlipidemia   . Hypertension   . Spinal stenosis     Patient Active Problem List   Diagnosis Date Noted  . S/P lumbar spinal fusion 11/13/2013  . Spinal stenosis, lumbar region, with neurogenic claudication 07/30/2012    Past Surgical History:  Procedure Laterality Date  . ACHILLES TENDON REPAIR     right  . ANTERIOR LAT LUMBAR FUSION Right 11/13/2013   Procedure: Extreme Lateral Lumbar Fusion L1-2, L2-3, L3-4;  Surgeon: Eustace Moore, MD;  Location: [facility];  Service: Neurosurgery;  Laterality: Right;  Extreme Lateral Lumbar Fusion L1-2, L2-3, L3-4  . CARPAL TUNNEL RELEASE Bilateral   . LUMBAR LAMINECTOMY/DECOMPRESSION MICRODISCECTOMY N/A 07/30/2012   Procedure: Lumbar Decompression L1 - S1 (X-Ray)4 LEVEL;  Surgeon: Johnn Hai, MD;  Location: [facility];  Service: Orthopedics;  Laterality: N/A;  Lumbar Decompression L1 - S1 (X-Ray)  . MAXIMUM ACCESS (MAS)POSTERIOR LUMBAR INTERBODY FUSION (PLIF) 1 LEVEL N/A 11/13/2013   Procedure: FOR MAXIMUM ACCESS (MAS) POSTERIOR LUMBAR INTERBODY FUSION (PLIF)  1 LEVEL;  Surgeon: Eustace Moore, MD;  Location: [facility];  Service: Neurosurgery;  Laterality: N/A;  FOR MAXIMUM ACCESS (MAS) POSTERIOR LUMBAR INTERBODY FUSION (PLIF) 1 LEVEL L4-L5, Cortical Screws Lumbar 1-5     OB History   No obstetric history on file.      Home Medications    Prior to Admission medications   Medication Sig Start Date End Date Taking? Authorizing Provider  aspirin EC 81 MG tablet Take 81 mg by mouth daily. States last dose will be 07/23/12 08/01/12   Cecilie Kicks, PA-C  Calcium Carbonate-Vitamin D (CALCIUM 600 + D PO) Take 1 tablet by mouth daily.     [provider]  diazepam (VALIUM) 5 MG tablet Take 1 tablet (5 mg total) by mouth every 8 (eight) hours as needed for muscle spasms. 11/16/13   Eustace Moore, MD  dimenhyDRINATE (DRAMAMINE) 50 MG tablet Take 50 mg by mouth at bedtime as needed for nausea.     [provider]  HYDROmorphone (DILAUDID) 4 MG tablet Take 1 tablet (4 mg total) by mouth every 4 (four) hours as needed for severe pain. 11/16/13   Eustace Moore, MD  lisinopril (PRINIVIL,ZESTRIL) 5 MG tablet Take 5 mg by mouth daily after breakfast.    [provider]  Multiple Vitamin (MULTIVITAMIN WITH MINERALS) TABS Take 1 tablet by mouth daily. States will stop 07/23/12    [provider]  Omega-3 Fatty Acids (FISH OIL) 1200 MG CAPS Take 1,200 mg by mouth 2 (  two) times daily with a meal.    [provider]  piroxicam (FELDENE) 20 MG capsule Take 20 mg by mouth daily after supper. States last dose 07/23/12    [provider]  pravastatin (PRAVACHOL) 40 MG tablet Take 80 mg by mouth daily after supper.    [provider]  promethazine (PHENERGAN) 12.5 MG tablet Take 1 tablet (12.5 mg total) by mouth every 6 (six) hours as needed for nausea or vomiting. 11/16/13   Eustace Moore, MD  zolpidem (AMBIEN) 10 MG tablet Take 10 mg by mouth at bedtime.    [provider]    Family  History History reviewed. No pertinent family history.  Social History Social History   Tobacco Use  . Smoking status: Never Smoker  . Smokeless tobacco: Never Used  Substance Use Topics  . Alcohol use: No  . Drug use: No     Allergies   Percocet [oxycodone-acetaminophen]   Review of Systems Review of Systems  Constitutional: Negative for chills, diaphoresis, fatigue and fever.  HENT: Negative for congestion, ear pain and sore throat.   Eyes: Negative for photophobia, pain and visual disturbance.  Respiratory: Negative for cough, choking, chest tightness and shortness of breath.   Cardiovascular: Negative for chest pain and palpitations.  Gastrointestinal: Negative for abdominal pain and vomiting.  Genitourinary: Negative for dysuria, flank pain, frequency and hematuria.  Musculoskeletal: Negative for arthralgias, back pain, neck pain and neck stiffness.  Skin: Positive for wound. Negative for color change and rash.  Neurological: Negative for dizziness, tremors, seizures, syncope, facial asymmetry, speech difficulty, weakness, light-headedness, numbness and headaches.  Psychiatric/Behavioral: Positive for sleep disturbance. Negative for agitation.  All other systems reviewed and are negative.    Physical Exam Updated Vital Signs BP 110/64 (BP Location: Right Arm)   Pulse 93   Temp 97.8 F (36.6 C) (Oral)   Resp 18   Ht 5' (1.524 m)   Wt 68 kg   SpO2 96%   BMI 29.29 kg/m   Physical Exam Vitals signs and nursing note reviewed.  Constitutional:      General: She is not in acute distress.    Appearance: She is well-developed. She is not ill-appearing, toxic-appearing or diaphoretic.  HENT:     Head:     Jaw: No trismus or tenderness.      Right Ear: External ear normal.     Left Ear: External ear normal.     Nose: Nose normal. No congestion or rhinorrhea.     Mouth/Throat:     Mouth: Mucous membranes are moist.     Pharynx: No oropharyngeal exudate.  Eyes:      Conjunctiva/sclera: Conjunctivae normal.     Pupils: Pupils are equal, round, and reactive to light.  Neck:     Musculoskeletal: Normal range of motion and neck supple.  Cardiovascular:     Rate and Rhythm: Normal rate.     Pulses: Normal pulses.     Heart sounds: No murmur.  Pulmonary:     Effort: No respiratory distress.     Breath sounds: No stridor. No wheezing, rhonchi or rales.  Chest:     Chest wall: No tenderness.  Abdominal:     General: Abdomen is flat. There is no distension.     Tenderness: There is no abdominal tenderness. There is no rebound.  Musculoskeletal:        General: Tenderness present. No swelling, deformity or signs of injury.     Right wrist:  She exhibits tenderness. She exhibits normal range of motion, no deformity and no laceration.     Right forearm: She exhibits tenderness. She exhibits no laceration.       Arms:     Comments: Tenderness in right hyperthenar area of wrist.  Normal grip strength, sensation, cap refill, and pulse.  No snuffbox tenderness.  Skin:    General: Skin is warm.     Capillary Refill: Capillary refill takes less than 2 seconds.     Findings: Bruising present. No erythema or rash.  Neurological:     Mental Status: She is alert and oriented to person, place, and time.     Motor: No weakness or abnormal muscle tone.     Coordination: Coordination normal.     Deep Tendon Reflexes: Reflexes are normal and symmetric.  Psychiatric:        Mood and Affect: Mood normal.      ED Treatments / Results  Labs (all labs ordered are listed, but only abnormal results are displayed) Labs Reviewed - No data to display  EKG EKG Interpretation  Date/Time:  Tuesday May 27 2018 20:20:38 EST Ventricular Rate:  92 PR Interval:    QRS Duration: 94 QT Interval:  361 QTC Calculation: 447 R Axis:   65 Text Interpretation:  Sinus rhythm Low voltage, extremity and precordial leads Consider anterior infarct Baseline wander in lead(s)  V5 V6 When compared to prior, no significant changes seen,  no STEMI Confirmed by Antony Blackbird [phone]) on 05/27/2018 9:03:19 PM   Radiology Dg Forearm Right  Result Date: 05/27/2018 CLINICAL DATA:  Patient fell last evening. Skin tear along the posterior mid forearm with bruising. EXAM: RIGHT FOREARM - 2 VIEW COMPARISON:  None. FINDINGS: There is mild diffuse dorsal soft tissue swelling of the mid forearm. No joint dislocation is identified. Carpal rows are intact. Subtle lucency at the base of the ulnar styloid may represent a nondisplaced ulnar styloid fracture. IMPRESSION: Subtle lucency at the base of the ulnar styloid may represent a nondisplaced ulnar styloid fracture. Dorsal soft tissue swelling of the forearm. Electronically Signed   By: Ashley Royalty M.D.   On: 05/27/2018 21:43   Ct Head Wo Contrast  Result Date: 05/27/2018 CLINICAL DATA:  Head trauma EXAM: CT HEAD WITHOUT CONTRAST CT MAXILLOFACIAL WITHOUT CONTRAST TECHNIQUE: Multidetector CT imaging of the head and maxillofacial structures were performed using the standard protocol without intravenous contrast. Multiplanar CT image reconstructions of the maxillofacial structures were also generated. COMPARISON:  None. FINDINGS: CT HEAD FINDINGS Brain: Age related involutional changes of the brain. No acute intracranial hemorrhage, midline shift or edema. No hydrocephalus. Midline fourth ventricle basal cisterns. No intra-axial mass nor extra-axial fluid. Vascular: No hyperdense vessel sign. Atherosclerosis of the carotid siphons. Skull: Intact bony calvarium. Other: Right lateral periorbital and right forehead soft tissue contusion. CT MAXILLOFACIAL FINDINGS Osseous: No fracture or mandibular dislocation. No destructive process. Orbits: Bilateral cataract extractions. No traumatic or inflammatory finding. Sinuses: Clear. Soft tissues: Right periorbital and forehead soft tissue contusion. IMPRESSION: 1. Right lateral periorbital and right  forehead soft tissue contusion. 2. No acute intracranial abnormality. 3. No acute maxillofacial fracture. Electronically Signed   By: Ashley Royalty M.D.   On: 05/27/2018 21:41   Ct Maxillofacial Wo Contrast  Result Date: 05/27/2018 CLINICAL DATA:  Head trauma EXAM: CT HEAD WITHOUT CONTRAST CT MAXILLOFACIAL WITHOUT CONTRAST TECHNIQUE: Multidetector CT imaging of the head and maxillofacial structures were performed using the standard protocol without intravenous contrast. Multiplanar CT image  reconstructions of the maxillofacial structures were also generated. COMPARISON:  None. FINDINGS: CT HEAD FINDINGS Brain: Age related involutional changes of the brain. No acute intracranial hemorrhage, midline shift or edema. No hydrocephalus. Midline fourth ventricle basal cisterns. No intra-axial mass nor extra-axial fluid. Vascular: No hyperdense vessel sign. Atherosclerosis of the carotid siphons. Skull: Intact bony calvarium. Other: Right lateral periorbital and right forehead soft tissue contusion. CT MAXILLOFACIAL FINDINGS Osseous: No fracture or mandibular dislocation. No destructive process. Orbits: Bilateral cataract extractions. No traumatic or inflammatory finding. Sinuses: Clear. Soft tissues: Right periorbital and forehead soft tissue contusion. IMPRESSION: 1. Right lateral periorbital and right forehead soft tissue contusion. 2. No acute intracranial abnormality. 3. No acute maxillofacial fracture. Electronically Signed   By: Ashley Royalty M.D.   On: 05/27/2018 21:41    Procedures Procedures (including critical care time)  Medications Ordered in ED Medications - No data to display   Initial Impression / Assessment and Plan / ED Course  I have reviewed the triage vital signs and the nursing notes.  Pertinent labs & imaging results that were available during my care of the patient were reviewed by me and considered in my medical decision making (see chart for details).     IZZABELLA BESSE is a 72  y.o. female with a past medical history significant for hypertension, hyperlipidemia, GERD, chronic back pain status post surgery, and sleep difficulties who presents with pain in her right arm and right face after a fall.  Patient reports that last night while staying up late at night she had a mechanical fall hitting the wall and the ground with her right forehead, right face, and right arm.  Patient denies loss of consciousness.  She denies any headaches, vision changes, nausea, vomiting, chest pain, shortness breath, abdominal pain, urinary symptoms or GI symptoms.  She denies any other complaints.  She is right-handed.  She reports skin tear to her right forearm and pain in her right distal arm.  No snuffbox pain or hand pain.  No reported weakness numbness or tingling.  Patient denies vision changes, double vision, blurry vision.  She reports mild headache.  Family reports that 1 month ago she had some changes made to her sleeping medications and has been on a combination of medicines including Flexeril.  Family reports that she has been having sleepiness for the last month and they do not report any change in mental status after the fall.  On exam, patient has bruising around her right orbit.  Tenderness in the right eyebrow area, no laceration to the head.  No focal neurologic deficit seen on exam.  Lungs clear and chest is nontender.  Grip strength symmetric, normal radial pulse, sensation, and cap refill in fingers bilaterally.  Patient has a small skin tear to her right forearm that is hemostatic.  Minimal tenderness in this location.  Some tenderness present in her right hyperthenar wrist.  No snuffbox tenderness.  Patient reports her tetanus is up-to-date.  Patient had CT of the head and face to look for injuries.  No nasal septal hematoma was seen on exam and CT of the head showed soft tissue contusion but no other intracranial injuries.  Given lack of headache, low suspicion for concussion.   No other intracranial injury seen.  Patient was however found to have a ulnar styloid fracture that is nondisplaced.  Patient was placed into a wrist splint and follow-up with orthopedic hand surgery.  Patient will use over-the-counter medications and rice therapy for pain  management.  She reports the pain is minimal.  Patient given extremely strict return precautions and follow-up instructions to follow-up with her PCP to discuss medication changes so she does not fall.  Had a shared judgment conversation with family as to pain management and we agreed not to prescribe strong pain medications.  Patient understood plan of care and had no complaints, including no report of headache or vision changes.  Patient no other questions or concerns and was discharged in good condition.   Final Clinical Impressions(s) / ED Diagnoses   Final diagnoses:  Closed nondisplaced fracture of styloid process of right ulna, initial encounter  Fall, initial encounter  Contusion of face, initial encounter  Skin tear of right forearm without complication, initial encounter    ED Discharge Orders    None      Clinical Impression: 1. Closed nondisplaced fracture of styloid process of right ulna, initial encounter   2. Fall, initial encounter   3. Contusion of face, initial encounter   4. Skin tear of right forearm without complication, initial encounter     Disposition: Discharge  Condition: Good  I have discussed the results, Dx and Tx plan with the pt(& family if present). He/she/they expressed understanding and agree(s) with the plan. Discharge instructions discussed at great length. Strict return precautions discussed and pt &/or family have verbalized understanding of the instructions. No further questions at time of discharge.    Discharge Medication List as of 05/27/2018 11:24 PM      Follow Up: [facility] [address] [id number] [place] [phone]    Your PCP to discuss medication changes     Charlotte Crumb, MD [place] [id number] [phone]  Schedule an appointment as soon as possible for a visit       Maddax Palinkas, Gwenyth Allegra, MD 05/28/18 [phone]

## 2018-05-27 NOTE — Discharge Instructions (Addendum)
All of the imaging we performed today fortunately only revealed an ulnar styloid fracture of your wrist.  Please use the wrist splint and follow-up with the orthopedic hand surgery team for further management.  Please use the rest, ice, compression, elevation we discussed to help with your discomfort.  You may use over-the-counter anti-inflammatory medications.  Please rest and stay hydrated.  Please follow-up with your primary doctor to discuss medication changes that have contributed to your sleepiness.  If any symptoms change or worsen, please return admitted to the nearest emergency department.

## 2018-05-27 NOTE — ED Triage Notes (Signed)
Pt arrives POV for eval of a fall. Pt reports that at some point last night she was walking to bed and had an unwitnessed fall. She does not recall events surrounding the fall. Pt w/ eccymosis to R eye/orbit. Husband reports he saw the hall lights go out, but she didn't come to bed thereafter and he went to check on her and found her slumped against the wall. Pt was responsive, they went to bed and she felt OK this AM. Daughter reports that pt has gotten increasingly lethargic throughout the day, nodding off at dinner. Husband reports he thought that this was due to new sleep medications. Pt denies HA, N/V, vision changes. GCS 15 on arrival

## 2018-05-27 NOTE — ED Notes (Signed)
Patient verbalizes understanding of discharge instructions. Opportunity for questioning and answers were provided. Armband removed by staff, pt discharged from ED ambulatory w/ daughter  

## 2018-06-02 DIAGNOSIS — S62101A Fracture of unspecified carpal bone, right wrist, initial encounter for closed fracture: Secondary | ICD-10-CM | POA: Insufficient documentation

## 2018-10-07 DIAGNOSIS — M79605 Pain in left leg: Secondary | ICD-10-CM | POA: Insufficient documentation

## 2018-10-07 DIAGNOSIS — F3341 Major depressive disorder, recurrent, in partial remission: Secondary | ICD-10-CM | POA: Insufficient documentation

## 2018-11-11 ENCOUNTER — Other Ambulatory Visit: Payer: Self-pay | Admitting: Neurological Surgery

## 2018-11-11 ENCOUNTER — Telehealth: Payer: Self-pay | Admitting: Nurse Practitioner

## 2018-11-11 DIAGNOSIS — M5416 Radiculopathy, lumbar region: Secondary | ICD-10-CM

## 2018-11-11 NOTE — Telephone Encounter (Signed)
Phone call to patient to verify medication list and allergies for myelogram procedure. Pt instructed to hold phenergan and effexor for 48hrs prior to myelogram appointment time. Pt verbalized understanding. Pre and post procedure instructions reviewed with pt.

## 2018-12-10 NOTE — Discharge Instructions (Signed)
Myelogram Discharge Instructions  1. Go home and rest quietly for the next 24 hours.  It is important to lie flat for the next 24 hours.  Get up only to go to the restroom.  You may lie in the bed or on a couch on your back, your stomach, your left side or your right side.  You may have one pillow under your head.  You may have pillows between your knees while you are on your side or under your knees while you are on your back.  2. DO NOT drive today.  Recline the seat as far back as it will go, while still wearing your seat belt, on the way home.  3. You may get up to go to the bathroom as needed.  You may sit up for 10 minutes to eat.  You may resume your normal diet and medications unless otherwise indicated.  Drink lots of extra fluids today and tomorrow.  4. The incidence of headache, nausea, or vomiting is about 5% (one in 20 patients).  If you develop a headache, lie flat and drink plenty of fluids until the headache goes away.  Caffeinated beverages may be helpful.  If you develop severe nausea and vomiting or a headache that does not go away with flat bed rest, call [phone].  5. You may resume normal activities after your 24 hours of bed rest is over; however, do not exert yourself strongly or do any heavy lifting tomorrow. If when you get up you have a headache when standing, go back to bed and force fluids for another 24 hours.  6. Call your physician for a follow-up appointment.  The results of your myelogram will be sent directly to your physician by the following day.  7. If you have any questions or if complications develop after you arrive home, please call [phone].  Discharge instructions have been explained to the patient.  The patient, or the person responsible for the patient, fully understands these instructions.  YOU MAY RESTART YOUR PHENERGAN AND EFFEXOR TOMORROW 12/12/2018 AT 9:00AM.

## 2018-12-11 ENCOUNTER — Other Ambulatory Visit: Payer: Self-pay

## 2018-12-11 ENCOUNTER — Ambulatory Visit
Admission: RE | Admit: 2018-12-11 | Discharge: 2018-12-11 | Disposition: A | Discharge status: Home / Self Care | Payer: 59 | Source: Ambulatory Visit | Attending: Neurological Surgery | Admitting: Neurological Surgery

## 2018-12-11 VITALS — BP 122/72 | HR 85

## 2018-12-11 DIAGNOSIS — M5416 Radiculopathy, lumbar region: Secondary | ICD-10-CM

## 2018-12-11 DIAGNOSIS — M48062 Spinal stenosis, lumbar region with neurogenic claudication: Secondary | ICD-10-CM

## 2018-12-11 DIAGNOSIS — Z981 Arthrodesis status: Secondary | ICD-10-CM

## 2018-12-11 MED ORDER — DIAZEPAM 5 MG PO TABS
5.0000 mg | ORAL_TABLET | Freq: Once | ORAL | Status: AC
  Administered 2018-12-11: 09:00:00 5 mg via ORAL

## 2018-12-11 MED ORDER — IOPAMIDOL (ISOVUE-M 200) INJECTION 41%
15.0000 mL | Freq: Once | INTRAMUSCULAR | Status: AC
  Administered 2018-12-11: 10:00:00 15 mL via INTRATHECAL

## 2018-12-11 NOTE — Progress Notes (Signed)
Patient states she has been off Effexor and Phenergan for at least the past two days.

## 2018-12-12 ENCOUNTER — Telehealth: Payer: Self-pay | Admitting: Nurse Practitioner

## 2018-12-12 NOTE — Telephone Encounter (Signed)
PC from pt c.o multiple (~3) near syncopal episodes starting yesterday afternoon after returning home from her myelogram. Denies history of them and states she has been eating/drinking plenty. Denies headache or other symptoms. States she "just feels really lightheaded and like i'm going to pass out, so I lay down on the ground." Dr. Nelia Shi made aware. Per Dr. Nelia Shi, pt to lay flat for an additional 24hrs and call PCP.

## 2019-02-10 DIAGNOSIS — M5416 Radiculopathy, lumbar region: Secondary | ICD-10-CM | POA: Diagnosis not present

## 2019-02-10 DIAGNOSIS — Z6826 Body mass index (BMI) 26.0-26.9, adult: Secondary | ICD-10-CM | POA: Diagnosis not present

## 2019-02-10 DIAGNOSIS — M961 Postlaminectomy syndrome, not elsewhere classified: Secondary | ICD-10-CM | POA: Diagnosis not present

## 2019-02-10 DIAGNOSIS — I10 Essential (primary) hypertension: Secondary | ICD-10-CM | POA: Diagnosis not present

## 2019-03-25 DIAGNOSIS — Z1231 Encounter for screening mammogram for malignant neoplasm of breast: Secondary | ICD-10-CM | POA: Diagnosis not present

## 2019-03-25 DIAGNOSIS — M85852 Other specified disorders of bone density and structure, left thigh: Secondary | ICD-10-CM | POA: Diagnosis not present

## 2019-03-25 DIAGNOSIS — Z780 Asymptomatic menopausal state: Secondary | ICD-10-CM | POA: Diagnosis not present

## 2019-03-25 DIAGNOSIS — M810 Age-related osteoporosis without current pathological fracture: Secondary | ICD-10-CM | POA: Diagnosis not present

## 2019-04-21 DIAGNOSIS — M5416 Radiculopathy, lumbar region: Secondary | ICD-10-CM | POA: Diagnosis not present

## 2019-05-18 DIAGNOSIS — N183 Chronic kidney disease, stage 3 unspecified: Secondary | ICD-10-CM | POA: Diagnosis not present

## 2019-05-18 DIAGNOSIS — E785 Hyperlipidemia, unspecified: Secondary | ICD-10-CM | POA: Diagnosis not present

## 2019-05-18 DIAGNOSIS — R413 Other amnesia: Secondary | ICD-10-CM | POA: Diagnosis not present

## 2019-05-18 DIAGNOSIS — F330 Major depressive disorder, recurrent, mild: Secondary | ICD-10-CM | POA: Diagnosis not present

## 2019-05-18 DIAGNOSIS — I129 Hypertensive chronic kidney disease with stage 1 through stage 4 chronic kidney disease, or unspecified chronic kidney disease: Secondary | ICD-10-CM | POA: Diagnosis not present

## 2019-05-18 DIAGNOSIS — G4700 Insomnia, unspecified: Secondary | ICD-10-CM | POA: Diagnosis not present

## 2019-05-18 DIAGNOSIS — E663 Overweight: Secondary | ICD-10-CM | POA: Diagnosis not present

## 2019-05-18 DIAGNOSIS — Z6829 Body mass index (BMI) 29.0-29.9, adult: Secondary | ICD-10-CM | POA: Diagnosis not present

## 2019-05-18 DIAGNOSIS — Z008 Encounter for other general examination: Secondary | ICD-10-CM | POA: Diagnosis not present

## 2019-05-18 DIAGNOSIS — M47815 Spondylosis without myelopathy or radiculopathy, thoracolumbar region: Secondary | ICD-10-CM | POA: Diagnosis not present

## 2019-05-18 DIAGNOSIS — G2581 Restless legs syndrome: Secondary | ICD-10-CM | POA: Diagnosis not present

## 2019-05-20 DIAGNOSIS — M961 Postlaminectomy syndrome, not elsewhere classified: Secondary | ICD-10-CM | POA: Diagnosis not present

## 2019-07-21 DIAGNOSIS — M5416 Radiculopathy, lumbar region: Secondary | ICD-10-CM | POA: Diagnosis not present

## 2019-10-19 DIAGNOSIS — M5416 Radiculopathy, lumbar region: Secondary | ICD-10-CM | POA: Diagnosis not present

## 2019-11-16 DIAGNOSIS — M961 Postlaminectomy syndrome, not elsewhere classified: Secondary | ICD-10-CM | POA: Diagnosis not present

## 2019-11-16 DIAGNOSIS — M5416 Radiculopathy, lumbar region: Secondary | ICD-10-CM | POA: Diagnosis not present

## 2019-12-22 DIAGNOSIS — M961 Postlaminectomy syndrome, not elsewhere classified: Secondary | ICD-10-CM | POA: Diagnosis not present

## 2020-01-04 DIAGNOSIS — M5416 Radiculopathy, lumbar region: Secondary | ICD-10-CM | POA: Diagnosis not present

## 2020-02-03 DIAGNOSIS — G894 Chronic pain syndrome: Secondary | ICD-10-CM | POA: Diagnosis not present

## 2020-02-03 DIAGNOSIS — M961 Postlaminectomy syndrome, not elsewhere classified: Secondary | ICD-10-CM | POA: Diagnosis not present

## 2020-02-03 DIAGNOSIS — M5416 Radiculopathy, lumbar region: Secondary | ICD-10-CM | POA: Diagnosis not present

## 2020-02-11 DIAGNOSIS — Z1211 Encounter for screening for malignant neoplasm of colon: Secondary | ICD-10-CM | POA: Diagnosis not present

## 2020-03-07 DIAGNOSIS — F0634 Mood disorder due to known physiological condition with mixed features: Secondary | ICD-10-CM | POA: Diagnosis not present

## 2020-03-07 DIAGNOSIS — G894 Chronic pain syndrome: Secondary | ICD-10-CM | POA: Diagnosis not present

## 2020-03-11 DIAGNOSIS — E785 Hyperlipidemia, unspecified: Secondary | ICD-10-CM | POA: Diagnosis not present

## 2020-03-11 DIAGNOSIS — M47815 Spondylosis without myelopathy or radiculopathy, thoracolumbar region: Secondary | ICD-10-CM | POA: Diagnosis not present

## 2020-03-11 DIAGNOSIS — Z0000 Encounter for general adult medical examination without abnormal findings: Secondary | ICD-10-CM | POA: Diagnosis not present

## 2020-03-11 DIAGNOSIS — G2581 Restless legs syndrome: Secondary | ICD-10-CM | POA: Diagnosis not present

## 2020-03-11 DIAGNOSIS — F330 Major depressive disorder, recurrent, mild: Secondary | ICD-10-CM | POA: Diagnosis not present

## 2020-03-11 DIAGNOSIS — K219 Gastro-esophageal reflux disease without esophagitis: Secondary | ICD-10-CM | POA: Diagnosis not present

## 2020-03-11 DIAGNOSIS — N183 Chronic kidney disease, stage 3 unspecified: Secondary | ICD-10-CM | POA: Diagnosis not present

## 2020-03-11 DIAGNOSIS — E669 Obesity, unspecified: Secondary | ICD-10-CM | POA: Diagnosis not present

## 2020-03-11 DIAGNOSIS — Z23 Encounter for immunization: Secondary | ICD-10-CM | POA: Diagnosis not present

## 2020-03-11 DIAGNOSIS — H938X3 Other specified disorders of ear, bilateral: Secondary | ICD-10-CM | POA: Diagnosis not present

## 2020-03-11 DIAGNOSIS — G4700 Insomnia, unspecified: Secondary | ICD-10-CM | POA: Diagnosis not present

## 2020-03-11 DIAGNOSIS — I129 Hypertensive chronic kidney disease with stage 1 through stage 4 chronic kidney disease, or unspecified chronic kidney disease: Secondary | ICD-10-CM | POA: Diagnosis not present

## 2020-03-17 DIAGNOSIS — R079 Chest pain, unspecified: Secondary | ICD-10-CM | POA: Diagnosis not present

## 2020-03-17 DIAGNOSIS — I10 Essential (primary) hypertension: Secondary | ICD-10-CM | POA: Diagnosis not present

## 2020-04-05 DIAGNOSIS — M5416 Radiculopathy, lumbar region: Secondary | ICD-10-CM | POA: Diagnosis not present

## 2020-04-20 DIAGNOSIS — Z1231 Encounter for screening mammogram for malignant neoplasm of breast: Secondary | ICD-10-CM | POA: Diagnosis not present

## 2020-05-06 DIAGNOSIS — G4700 Insomnia, unspecified: Secondary | ICD-10-CM | POA: Diagnosis not present

## 2020-05-06 DIAGNOSIS — M19042 Primary osteoarthritis, left hand: Secondary | ICD-10-CM | POA: Diagnosis not present

## 2020-05-06 DIAGNOSIS — M19041 Primary osteoarthritis, right hand: Secondary | ICD-10-CM | POA: Diagnosis not present

## 2020-05-06 DIAGNOSIS — G8929 Other chronic pain: Secondary | ICD-10-CM | POA: Diagnosis not present

## 2020-05-06 DIAGNOSIS — G2581 Restless legs syndrome: Secondary | ICD-10-CM | POA: Diagnosis not present

## 2020-05-06 DIAGNOSIS — Z008 Encounter for other general examination: Secondary | ICD-10-CM | POA: Diagnosis not present

## 2020-05-06 DIAGNOSIS — Z0000 Encounter for general adult medical examination without abnormal findings: Secondary | ICD-10-CM | POA: Diagnosis not present

## 2020-05-06 DIAGNOSIS — M549 Dorsalgia, unspecified: Secondary | ICD-10-CM | POA: Diagnosis not present

## 2021-02-27 ENCOUNTER — Encounter: Payer: Self-pay | Admitting: Gastroenterology

## 2021-04-18 ENCOUNTER — Other Ambulatory Visit: Payer: Self-pay

## 2021-04-18 ENCOUNTER — Ambulatory Visit (AMBULATORY_SURGERY_CENTER): Payer: Medicare HMO | Admitting: *Deleted

## 2021-04-18 ENCOUNTER — Encounter: Payer: Self-pay | Admitting: Gastroenterology

## 2021-04-18 VITALS — Ht 59.0 in | Wt 140.0 lb

## 2021-04-18 DIAGNOSIS — Z1211 Encounter for screening for malignant neoplasm of colon: Secondary | ICD-10-CM

## 2021-04-18 MED ORDER — NA SULFATE-K SULFATE-MG SULF 17.5-3.13-1.6 GM/177ML PO SOLN: 1.0000 | Freq: Once | ORAL | 0 refills | Status: AC

## 2021-04-18 NOTE — Progress Notes (Signed)

## 2021-05-02 ENCOUNTER — Ambulatory Visit (AMBULATORY_SURGERY_CENTER): Payer: Medicare HMO | Admitting: Gastroenterology

## 2021-05-02 ENCOUNTER — Other Ambulatory Visit: Payer: Self-pay

## 2021-05-02 ENCOUNTER — Encounter: Payer: Self-pay | Admitting: Gastroenterology

## 2021-05-02 VITALS — BP 110/66 | HR 96 | Temp 97.5°F | Resp 16 | Ht 60.0 in | Wt 137.0 lb

## 2021-05-02 DIAGNOSIS — D129 Benign neoplasm of anus and anal canal: Secondary | ICD-10-CM

## 2021-05-02 DIAGNOSIS — Z1211 Encounter for screening for malignant neoplasm of colon: Secondary | ICD-10-CM

## 2021-05-02 DIAGNOSIS — D125 Benign neoplasm of sigmoid colon: Secondary | ICD-10-CM

## 2021-05-02 DIAGNOSIS — D124 Benign neoplasm of descending colon: Secondary | ICD-10-CM

## 2021-05-02 DIAGNOSIS — D128 Benign neoplasm of rectum: Secondary | ICD-10-CM

## 2021-05-02 DIAGNOSIS — D122 Benign neoplasm of ascending colon: Secondary | ICD-10-CM | POA: Diagnosis not present

## 2021-05-02 MED ORDER — SODIUM CHLORIDE 0.9 % IV SOLN: 500.0000 mL | INTRAVENOUS | Status: DC

## 2021-05-02 NOTE — Progress Notes (Signed)
Called to room to assist during endoscopic procedure.  Patient ID and intended procedure confirmed with present staff. Received instructions for my participation in the procedure from the performing physician.  

## 2021-05-02 NOTE — Progress Notes (Signed)
Vs by CW; no changes to health hx since previsit

## 2021-05-02 NOTE — Progress Notes (Signed)
[facility] History and Physical   Primary Care Physician:  Wynelle Fanny, DO   Reason for Procedure:   Colorectal cancer screening  Plan:     colonoscopy     HPI: [name] is a 75 y.o. female   for colonoscopy  Past Medical History:  Diagnosis Date   Arthritis    Blood transfusion without reported diagnosis    Cataract    removed bilaterally   Complication of anesthesia    "slow to wake up"   GERD (gastroesophageal reflux disease)    Hyperlipidemia    Hypertension    Neuromuscular disorder (HCC)    nerve pain in feet   Sleep apnea    no cpap   Spinal stenosis     Past Surgical History:  Procedure Laterality Date   ACHILLES TENDON REPAIR     right   ANTERIOR LAT LUMBAR FUSION Right 11/13/2013   Procedure: Extreme Lateral Lumbar Fusion L1-2, L2-3, L3-4;  Surgeon: Eustace Moore, MD;  Location: [facility];  Service: Neurosurgery;  Laterality: Right;  Extreme Lateral Lumbar Fusion L1-2, L2-3, L3-4   CARPAL TUNNEL RELEASE Bilateral    CATARACT EXTRACTION, BILATERAL     with lens implants   COLONOSCOPY     last 04-12-2011   LUMBAR LAMINECTOMY/DECOMPRESSION MICRODISCECTOMY N/A 07/30/2012   Procedure: Lumbar Decompression L1 - S1 (X-Ray)4 LEVEL;  Surgeon: Johnn Hai, MD;  Location: [facility];  Service: Orthopedics;  Laterality: N/A;  Lumbar Decompression L1 - S1 (X-Ray)   MAXIMUM ACCESS (MAS)POSTERIOR LUMBAR INTERBODY FUSION (PLIF) 1 LEVEL N/A 11/13/2013   Procedure: FOR MAXIMUM ACCESS (MAS) POSTERIOR LUMBAR INTERBODY FUSION (PLIF) 1 LEVEL;  Surgeon: Eustace Moore, MD;  Location: [facility];  Service: Neurosurgery;  Laterality: N/A;  FOR MAXIMUM ACCESS (MAS) POSTERIOR LUMBAR INTERBODY FUSION (PLIF) 1 LEVEL L4-L5, Cortical Screws Lumbar 1-5    Prior to Admission medications   Medication Sig Start Date End Date Taking? Authorizing Provider  aspirin EC 81 MG tablet Take 81 mg by mouth daily. States last dose will be 07/23/12 08/01/12   Cecilie Kicks, PA-C  Calcium Carb-Cholecalciferol (CALCIUM PLUS VITAMIN D3 PO) Take by mouth daily.    [provider]  Coenzyme Q10 (CO Q 10 PO) Take 120 mg by mouth daily.    [provider]  gabapentin (NEURONTIN) 300 MG capsule Take 300 mg by mouth 3 (three) times daily. Pt takes 1 x a day at bedtime    [provider]  lisinopril (PRINIVIL,ZESTRIL) 5 MG tablet Take 5 mg by mouth daily.     [provider]  Multiple Vitamin (MULTIVITAMIN) tablet Take 1 tablet by mouth daily.    [provider]  OVER THE COUNTER MEDICATION Nervive for nerve pain daily at bedtime    [provider]  pravastatin (PRAVACHOL) 80 MG tablet Take 80 mg by mouth daily.    [provider]  promethazine (PHENERGAN) 12.5 MG tablet Take 1 tablet (12.5 mg total) by mouth every 6 (six) hours as needed for nausea or vomiting. 11/16/13   Eustace Moore, MD  venlafaxine XR (EFFEXOR-XR) 150 MG 24 hr capsule Take 150 mg by mouth daily.    [provider]  zolpidem (AMBIEN) 10 MG tablet Use one-half to one by mouth at night prn insomnia. 01/09/19   [provider]    Current Outpatient Medications  Medication Sig Dispense Refill   aspirin EC 81 MG tablet Take 81 mg by mouth daily.  States last dose will be 07/23/12     Calcium Carb-Cholecalciferol (CALCIUM PLUS VITAMIN D3 PO) Take by mouth daily.     Coenzyme Q10 (CO Q 10 PO) Take 120 mg by mouth daily.     gabapentin (NEURONTIN) 300 MG capsule Take 300 mg by mouth 3 (three) times daily. Pt takes 1 x a day at bedtime     lisinopril (PRINIVIL,ZESTRIL) 5 MG tablet Take 5 mg by mouth daily.      Multiple Vitamin (MULTIVITAMIN) tablet Take 1 tablet by mouth daily.     OVER THE COUNTER MEDICATION Nervive for nerve pain daily at bedtime     pravastatin (PRAVACHOL) 80 MG tablet Take 80 mg by mouth daily.     promethazine (PHENERGAN) 12.5 MG tablet Take 1 tablet (12.5 mg total) by mouth every 6 (six) hours as needed for  nausea or vomiting. 30 tablet 0   venlafaxine XR (EFFEXOR-XR) 150 MG 24 hr capsule Take 150 mg by mouth daily.     zolpidem (AMBIEN) 10 MG tablet Use one-half to one by mouth at night prn insomnia.     Current Facility-Administered Medications  Medication Dose Route Frequency Provider Last Rate Last Admin   0.9 %  sodium chloride infusion  500 mL Intravenous Continuous Jackquline Denmark, MD        Allergies as of 05/02/2021 - Review Complete 05/02/2021  Allergen Reaction Noted   Percocet [oxycodone-acetaminophen] Nausea Only 07/22/2012    Family History  Problem Relation Age of Onset   Colon cancer Neg Hx    Colon polyps Neg Hx    Esophageal cancer Neg Hx    Rectal cancer Neg Hx    Stomach cancer Neg Hx     Social History   Socioeconomic History   Marital status: Married    Spouse name: Not on file   Number of children: Not on file   Years of education: Not on file   Highest education level: Not on file  Occupational History   Not on file  Tobacco Use   Smoking status: Never   Smokeless tobacco: Never  Substance and Sexual Activity   Alcohol use: No   Drug use: No   Sexual activity: Not on file  Other Topics Concern   Not on file  Social History Narrative   Not on file   Social Determinants of Health   Financial Resource Strain: Not on file  Food Insecurity: Not on file  Transportation Needs: Not on file  Physical Activity: Not on file  Stress: Not on file  Social Connections: Not on file  Intimate Partner Violence: Not on file    Review of Systems: Positive for NONE All other review of systems negative except as mentioned in the HPI.  Physical Exam: Vital signs in last 24 hours: @VSRANGES @   General:   Alert,  Well-developed, well-nourished, pleasant and cooperative in NAD Lungs:  Clear throughout to auscultation.   Heart:  Regular rate and rhythm; no murmurs, clicks, rubs,  or gallops. Abdomen:  Soft, nontender and nondistended. Normal bowel sounds.    Neuro/Psych:  Alert and cooperative. Normal mood and affect. A and O x 3    No significant changes were identified.  The patient continues to be an appropriate candidate for the planned procedure and anesthesia.   Carmell Austria, MD. [facility] 05/02/2021 10:49 AM@

## 2021-05-02 NOTE — Patient Instructions (Signed)
Handouts given for polyps and diverticulosis.  No NSAIDS (Non-Steroidal anti-inflammatory drugs) for 5 days.  (To prevent excess bleeding)  (These include, aspirin, aspirin-containing products, ibuprofen, advil, motrin, naproxen, aleve, goody powders, etc) Tylenol is ok to take as needed, see label for instructions.    YOU HAD AN ENDOSCOPIC PROCEDURE TODAY AT [facility]:   Refer to the procedure report that was given to you for any specific questions about what was found during the examination.  If the procedure report does not answer your questions, please call your gastroenterologist to clarify.  If you requested that your care partner not be given the details of your procedure findings, then the procedure report has been included in a sealed envelope for you to review at your convenience later.  YOU SHOULD EXPECT: Some feelings of bloating in the abdomen. Passage of more gas than usual.  Walking can help get rid of the air that was put into your GI tract during the procedure and reduce the bloating. If you had a lower endoscopy (such as a colonoscopy or flexible sigmoidoscopy) you may notice spotting of blood in your stool or on the toilet paper. If you underwent a bowel prep for your procedure, you may not have a normal bowel movement for a few days.  Please Note:  You might notice some irritation and congestion in your nose or some drainage.  This is from the oxygen used during your procedure.  There is no need for concern and it should clear up in a day or so.  SYMPTOMS TO REPORT IMMEDIATELY:  Following lower endoscopy (colonoscopy or flexible sigmoidoscopy):  Excessive amounts of blood in the stool  Significant tenderness or worsening of abdominal pains  Swelling of the abdomen that is new, acute  Fever of 100F or higher  For urgent or emergent issues, a gastroenterologist can be reached at any hour by calling [phone]. Do not use MyChart messaging for urgent  concerns.    DIET:  We do recommend a small meal at first, but then you may proceed to your regular diet.  Drink plenty of fluids but you should avoid alcoholic beverages for 24 hours.  ACTIVITY:  You should plan to take it easy for the rest of today and you should NOT DRIVE or use heavy machinery until tomorrow (because of the sedation medicines used during the test).    FOLLOW UP: Our staff will call the number listed on your records 48-72 hours following your procedure to check on you and address any questions or concerns that you may have regarding the information given to you following your procedure. If we do not reach you, we will leave a message.  We will attempt to reach you two times.  During this call, we will ask if you have developed any symptoms of COVID 19. If you develop any symptoms (ie: fever, flu-like symptoms, shortness of breath, cough etc.) before then, please call [phone].  If you test positive for Covid 19 in the 2 weeks post procedure, please call and report this information to [place].    If any biopsies were taken you will be contacted by phone or by letter within the next 1-3 weeks.  Please call us at [phone] if you have not heard about the biopsies in 3 weeks.    SIGNATURES/CONFIDENTIALITY: You and/or your care partner have signed paperwork which will be entered into your electronic medical record.  These signatures attest to the fact that that the information  above on your After Visit Summary has been reviewed and is understood.  Full responsibility of the confidentiality of this discharge information lies with you and/or your care-partner.  

## 2021-05-02 NOTE — Op Note (Signed)
[facility] Patient Name: [name] Procedure Date: 05/02/2021 10:43 AM MRN: [id number] Endoscopist: Jackquline Denmark , MD Age: 75 Referring MD:  Date of Birth: 1945/11/26 Gender: Female Account #: [id number] Procedure:                Colonoscopy Indications:              Screening for colorectal malignant neoplasm Medicines:                Monitored Anesthesia Care Procedure:                Pre-Anesthesia Assessment:                           - Prior to the procedure, a History and Physical                            was performed, and patient medications and                            allergies were reviewed. The patient's tolerance of                            previous anesthesia was also reviewed. The risks                            and benefits of the procedure and the sedation                            options and risks were discussed with the patient.                            All questions were answered, and informed consent                            was obtained. Prior Anticoagulants: The patient has                            taken no previous anticoagulant or antiplatelet                            agents. ASA Grade Assessment: II - A patient with                            mild systemic disease. After reviewing the risks                            and benefits, the patient was deemed in                            satisfactory condition to undergo the procedure.                           After obtaining informed consent, the colonoscope  was passed under direct vision. Throughout the                            procedure, the patient's blood pressure, pulse, and                            oxygen saturations were monitored continuously. The                            PCF-HQ190L Colonoscope was introduced through the                            anus and advanced to the 2 cm into the ileum. The                            colonoscopy  was performed without difficulty. The                            patient tolerated the procedure well. The quality                            of the bowel preparation was good. The terminal                            ileum, ileocecal valve, appendiceal orifice, and                            rectum were photographed. Scope In: 11:05:01 AM Scope Out: 11:21:13 AM Scope Withdrawal Time: 0 hours 12 minutes 26 seconds  Total Procedure Duration: 0 hours 16 minutes 12 seconds  Findings:                 Five sessile polyps were found in the rectum, mid                            sigmoid colon (2), descending colon and distal                            ascending colon. The polyps were 6 to 8 mm in size.                            These polyps were removed with a cold snare.                            Resection and retrieval were complete.                           A few small-mouthed diverticula were found in the                            sigmoid colon.                           Non-bleeding internal hemorrhoids were found during  retroflexion. The hemorrhoids were small and Grade                            I (internal hemorrhoids that do not prolapse).                           The terminal ileum appeared normal.                           The exam was otherwise without abnormality on                            direct and retroflexion views. Complications:            No immediate complications. Estimated Blood Loss:     Estimated blood loss: none. Impression:               - Five 6 to 8 mm polyps in the rectum, in the mid                            sigmoid colon, in the descending colon and in the                            distal ascending colon, removed with a cold snare.                            Resected and retrieved.                           - Mild sigmoid diverticulosis.                           - Non-bleeding internal hemorrhoids.                           -  The examined portion of the ileum was normal.                           - The examination was otherwise normal on direct                            and retroflexion views. Recommendation:           - Patient has a contact number available for                            emergencies. The signs and symptoms of potential                            delayed complications were discussed with the                            patient. Return to normal activities tomorrow.                            Written discharge instructions  were provided to the                            patient.                           - Resume previous diet.                           - Continue present medications.                           - Await pathology results.                           - No aspirin, ibuprofen, naproxen, or other                            non-steroidal anti-inflammatory drugs for 5 days                            after polyp removal.                           - The findings and recommendations were discussed                            with the patient's family. Jackquline Denmark, MD 05/02/2021 11:29:20 AM This report has been signed electronically.

## 2021-05-02 NOTE — Progress Notes (Signed)
Report to PACU, RN, vss, BBS= Clear.  

## 2021-05-04 ENCOUNTER — Telehealth: Payer: Self-pay | Admitting: *Deleted

## 2021-05-04 NOTE — Telephone Encounter (Signed)
  Follow up Call-  Call back number 05/02/2021  Post procedure Call Back phone  # [phone]  Permission to leave phone message Yes  Some recent data might be hidden     Patient questions:  Do you have a fever, pain , or abdominal swelling? No. Pain Score  0 *  Have you tolerated food without any problems? Yes.    Have you been able to return to your normal activities? Yes.    Do you have any questions about your discharge instructions: Diet   No. Medications  No. Follow up visit  No.  Do you have questions or concerns about your Care? No.  Actions: * If pain score is 4 or above: No action needed, pain <4.  Have you developed a fever since your procedure? no  2.   Have you had an respiratory symptoms (SOB or cough) since your procedure? no  3.   Have you tested positive for COVID 19 since your procedure no  4.   Have you had any family members/close contacts diagnosed with the COVID 19 since your procedure?  no   If yes to any of these questions please route to Joylene John, RN and Joella Prince, RN

## 2021-05-07 ENCOUNTER — Encounter: Payer: Self-pay | Admitting: Gastroenterology

## 2021-08-22 DIAGNOSIS — M810 Age-related osteoporosis without current pathological fracture: Secondary | ICD-10-CM | POA: Insufficient documentation

## 2021-08-22 DIAGNOSIS — I700 Atherosclerosis of aorta: Secondary | ICD-10-CM | POA: Insufficient documentation

## 2021-08-22 DIAGNOSIS — F1320 Sedative, hypnotic or anxiolytic dependence, uncomplicated: Secondary | ICD-10-CM | POA: Insufficient documentation

## 2021-08-22 DIAGNOSIS — E785 Hyperlipidemia, unspecified: Secondary | ICD-10-CM | POA: Insufficient documentation

## 2021-08-22 DIAGNOSIS — E1122 Type 2 diabetes mellitus with diabetic chronic kidney disease: Secondary | ICD-10-CM | POA: Insufficient documentation

## 2021-08-22 DIAGNOSIS — R400 Somnolence: Secondary | ICD-10-CM | POA: Insufficient documentation

## 2021-08-22 DIAGNOSIS — M545 Low back pain, unspecified: Secondary | ICD-10-CM | POA: Insufficient documentation

## 2021-10-27 ENCOUNTER — Other Ambulatory Visit: Payer: Self-pay | Admitting: Neurosurgery

## 2021-10-27 DIAGNOSIS — M5416 Radiculopathy, lumbar region: Secondary | ICD-10-CM

## 2021-11-02 ENCOUNTER — Emergency Department (HOSPITAL_COMMUNITY): Payer: Medicare HMO

## 2021-11-02 ENCOUNTER — Emergency Department (HOSPITAL_COMMUNITY)
Admission: EM | Admit: 2021-11-02 | Discharge: 2021-11-02 | Disposition: A | Discharge status: Home / Self Care | Payer: Medicare HMO | Attending: Emergency Medicine | Admitting: Emergency Medicine

## 2021-11-02 ENCOUNTER — Encounter (HOSPITAL_COMMUNITY): Payer: Self-pay

## 2021-11-02 ENCOUNTER — Other Ambulatory Visit: Payer: Self-pay

## 2021-11-02 DIAGNOSIS — W19XXXA Unspecified fall, initial encounter: Secondary | ICD-10-CM

## 2021-11-02 DIAGNOSIS — S0003XA Contusion of scalp, initial encounter: Secondary | ICD-10-CM | POA: Diagnosis not present

## 2021-11-02 DIAGNOSIS — Z7982 Long term (current) use of aspirin: Secondary | ICD-10-CM | POA: Diagnosis not present

## 2021-11-02 DIAGNOSIS — R519 Headache, unspecified: Secondary | ICD-10-CM | POA: Diagnosis not present

## 2021-11-02 DIAGNOSIS — W06XXXA Fall from bed, initial encounter: Secondary | ICD-10-CM | POA: Diagnosis not present

## 2021-11-02 DIAGNOSIS — S0990XA Unspecified injury of head, initial encounter: Secondary | ICD-10-CM | POA: Diagnosis present

## 2021-11-02 DIAGNOSIS — M545 Low back pain, unspecified: Secondary | ICD-10-CM | POA: Diagnosis not present

## 2021-11-02 LAB — URINALYSIS, ROUTINE W REFLEX MICROSCOPIC
Bilirubin Urine: NEGATIVE
Glucose, UA: NEGATIVE mg/dL
Hgb urine dipstick: NEGATIVE
Ketones, ur: NEGATIVE mg/dL
Nitrite: NEGATIVE
Protein, ur: NEGATIVE mg/dL
Specific Gravity, Urine: 1.013 (ref 1.005–1.030)
pH: 6 (ref 5.0–8.0)

## 2021-11-02 LAB — BASIC METABOLIC PANEL
Anion gap: 8 (ref 5–15)
BUN: 11 mg/dL (ref 8–23)
CO2: 26 mmol/L (ref 22–32)
Calcium: 9.5 mg/dL (ref 8.9–10.3)
Chloride: 102 mmol/L (ref 98–111)
Creatinine, Ser: 0.83 mg/dL (ref 0.44–1.00)
GFR, Estimated: >60 mL/min (ref >60)
Glucose, Bld: 180 mg/dL — ABNORMAL HIGH (ref 70–99)
Potassium: 4.4 mmol/L (ref 3.5–5.1)
Sodium: 136 mmol/L (ref 135–145)

## 2021-11-02 LAB — CBC WITH DIFFERENTIAL/PLATELET
Abs Immature Granulocytes: 0.05 10*3/uL (ref 0.00–0.07)
Basophils Absolute: 0.1 10*3/uL (ref 0.0–0.1)
Basophils Relative: 1 %
Eosinophils Absolute: 0.2 10*3/uL (ref 0.0–0.5)
Eosinophils Relative: 2 %
HCT: 43.8 % (ref 36.0–46.0)
Hemoglobin: 14 g/dL (ref 12.0–15.0)
Immature Granulocytes: 0 %
Lymphocytes Relative: 23 %
Lymphs Abs: 2.7 10*3/uL (ref 0.7–4.0)
MCH: 30 pg (ref 26.0–34.0)
MCHC: 32 g/dL (ref 30.0–36.0)
MCV: 93.8 fL (ref 80.0–100.0)
Monocytes Absolute: 0.9 10*3/uL (ref 0.1–1.0)
Monocytes Relative: 8 %
Neutro Abs: 7.8 10*3/uL — ABNORMAL HIGH (ref 1.7–7.7)
Neutrophils Relative %: 66 %
Platelets: 377 10*3/uL (ref 150–400)
RBC: 4.67 MIL/uL (ref 3.87–5.11)
RDW: 13.4 % (ref 11.5–15.5)
WBC: 11.7 10*3/uL — ABNORMAL HIGH (ref 4.0–10.5)
nRBC: 0 % (ref 0.0–0.2)

## 2021-11-02 NOTE — Discharge Instructions (Signed)
You have been seen and discharged from the emergency department.  Your blood work was normal.  Urinalysis showed no UTI.  CT of the-year-old bar spine showed chronic injury/findings.  You still need to follow-up at your scheduled myelogram.  Follow-up with your primary provider for further evaluation and further care. Take home medications as prescribed. If you have any worsening symptoms or further concerns for your health please return to an emergency department for further evaluation.

## 2021-11-02 NOTE — ED Provider Notes (Signed)
[facility] EMERGENCY DEPARTMENT Provider Note   CSN: [id number] Arrival date & time: 11/02/21  0849     History  Chief Complaint  Patient presents with   Lytle Michaels    [name] is a 76 y.o. female.  HPI  76 year old female presents to the emergency department accompanied by husband for concern of multiple falls.  Over the past week the patient has reportedly been falling out of bed when trying to get up to use the bathroom.  Patient has history of restless legs and takes Ambien along with other medications that could be sedating.  He states that she gets out of bed with the intention to go the bathroom and typically slides to the ground or has her legs "give out".  Patient today had posterior head injury with mild headache.  Not on anticoagulation.  Also complaining of acute on chronic lower back pain.  She has significant chronic back pain diagnoses that is followed as an outpatient.  She is scheduled for a CT of the lumbar spine and myelogram in a week.  States that her pain is otherwise baseline, no acute neurologic changes from baseline.  No saddle anesthesia or incontinence, her gait is baseline.  Daughters state that she had a recent UTI and took amoxicillin.  Patient denies any acute infectious symptoms.  Home Medications Prior to Admission medications   Medication Sig Start Date End Date Taking? Authorizing Provider  aspirin EC 81 MG tablet Take 81 mg by mouth daily. States last dose will be 07/23/12 08/01/12   Cecilie Kicks, PA-C  Calcium Carb-Cholecalciferol (CALCIUM PLUS VITAMIN D3 PO) Take by mouth daily.    [provider]  Coenzyme Q10 (CO Q 10 PO) Take 120 mg by mouth daily.    [provider]  gabapentin (NEURONTIN) 300 MG capsule Take 300 mg by mouth 3 (three) times daily. Pt takes 1 x a day at bedtime    [provider]  lisinopril (PRINIVIL,ZESTRIL) 5 MG tablet Take 5 mg by mouth daily.     [provider]   Multiple Vitamin (MULTIVITAMIN) tablet Take 1 tablet by mouth daily.    [provider]  OVER THE COUNTER MEDICATION Nervive for nerve pain daily at bedtime    [provider]  pravastatin (PRAVACHOL) 80 MG tablet Take 80 mg by mouth daily.    [provider]  promethazine (PHENERGAN) 12.5 MG tablet Take 1 tablet (12.5 mg total) by mouth every 6 (six) hours as needed for nausea or vomiting. 11/16/13   Eustace Moore, MD  venlafaxine XR (EFFEXOR-XR) 150 MG 24 hr capsule Take 150 mg by mouth daily.    [provider]  zolpidem (AMBIEN) 10 MG tablet Use one-half to one by mouth at night prn insomnia. 01/09/19   [provider]      Allergies    Percocet [oxycodone-acetaminophen]    Review of Systems   Review of Systems  Constitutional:  Positive for fatigue. Negative for fever.  Respiratory:  Negative for shortness of breath.   Cardiovascular:  Negative for chest pain.  Gastrointestinal:  Negative for abdominal pain, diarrhea and vomiting.  Musculoskeletal:  Positive for back pain.  Skin:  Negative for rash.  Neurological:  Positive for headaches. Negative for seizures, syncope, facial asymmetry, speech difficulty, weakness, light-headedness and numbness.   Physical Exam Updated Vital Signs BP 134/79 (BP Location: Right Arm)   Pulse (!) 102   Temp 98.2 F (36.8 C) (Oral)  Resp 16   Ht 5' (1.524 m)   Wt 68 kg   SpO2 95%   BMI 29.29 kg/m  Physical Exam Vitals and nursing note reviewed.  Constitutional:      General: She is not in acute distress.    Appearance: Normal appearance.  HENT:     Head: Normocephalic.     Comments: Small hematoma to the right posterior scalp, no bleeding    Mouth/Throat:     Mouth: Mucous membranes are moist.  Eyes:     Pupils: Pupils are equal, round, and reactive to light.  Cardiovascular:     Rate and Rhythm: Normal rate.  Pulmonary:     Effort: Pulmonary effort is normal. No respiratory distress.   Abdominal:     Palpations: Abdomen is soft.     Tenderness: There is no abdominal tenderness.  Musculoskeletal:     Cervical back: Tenderness present. No rigidity.  Skin:    General: Skin is warm.  Neurological:     General: No focal deficit present.     Mental Status: She is alert and oriented to person, place, and time. Mental status is at baseline.     Comments: Alert and oriented x3, ambulates independently with a steady gait  Psychiatric:        Mood and Affect: Mood normal.    ED Results / Procedures / Treatments   Labs (all labs ordered are listed, but only abnormal results are displayed) Labs Reviewed  CBC WITH DIFFERENTIAL/PLATELET - Abnormal; Notable for the following components:      Result Value   WBC 11.7 (*)    Neutro Abs 7.8 (*)    All other components within normal limits  BASIC METABOLIC PANEL - Abnormal; Notable for the following components:   Glucose, Bld 180 (*)    All other components within normal limits  URINALYSIS, ROUTINE W REFLEX MICROSCOPIC - Abnormal; Notable for the following components:   Leukocytes,Ua MODERATE (*)    Bacteria, UA RARE (*)    All other components within normal limits    EKG EKG Interpretation  Date/Time:  Thursday November 02 2021 08:57:06 EDT Ventricular Rate:  106 PR Interval:  144 QRS Duration: 72 QT Interval:  338 QTC Calculation: 448 R Axis:   105 Text Interpretation: Sinus tachycardia Rightward axis Low voltage QRS Borderline ECG When compared with ECG of 27-May-2018 20:20, PREVIOUS ECG IS PRESENT Confirmed by Lavenia Atlas [phone]) on 11/02/2021 10:16:22 AM  Radiology CT Head Wo Contrast  Result Date: 11/02/2021 CLINICAL DATA:  Polytrauma, blunt EXAM: CT HEAD WITHOUT CONTRAST TECHNIQUE: Contiguous axial images were obtained from the base of the skull through the vertex without intravenous contrast. RADIATION DOSE REDUCTION: This exam was performed according to the departmental dose-optimization program which includes  automated exposure control, adjustment of the mA and/or kV according to patient size and/or use of iterative reconstruction technique. COMPARISON:  None Available. FINDINGS: Brain: There is no acute intracranial hemorrhage, mass effect, or edema. Gray-white differentiation is preserved. There is no extra-axial fluid collection. Ventricles and sulci are within normal limits in size and configuration. Patchy hypoattenuation in the supratentorial white matter is nonspecific but may reflect chronic microvascular ischemic changes. Vascular: There is atherosclerotic calcification at the skull base. Skull: Calvarium is unremarkable. Sinuses/Orbits: No acute finding. Other: None. IMPRESSION: No evidence of acute intracranial injury. Electronically Signed   By: Macy Mis M.D.   On: 11/02/2021 12:25   CT Cervical Spine Wo Contrast  Result Date: 11/02/2021 CLINICAL DATA:  Polytrauma, blunt EXAM: CT CERVICAL SPINE WITHOUT CONTRAST TECHNIQUE: Multidetector CT imaging of the cervical spine was performed without intravenous contrast. Multiplanar CT image reconstructions were also generated. RADIATION DOSE REDUCTION: This exam was performed according to the departmental dose-optimization program which includes automated exposure control, adjustment of the mA and/or kV according to patient size and/or use of iterative reconstruction technique. COMPARISON:  None Available. FINDINGS: Alignment: Trace anterolisthesis at C3-C4 and C4-C5. Skull base and vertebrae: No acute fracture. Soft tissues and spinal canal: No prevertebral fluid or swelling. No visible canal hematoma. Disc levels: Multilevel degenerative changes are present including disc space narrowing, endplate osteophytes, and facet and uncovertebral hypertrophy. Upper chest: No apical lung mass. Other: Mild calcified plaque at the common carotid bifurcations. IMPRESSION: No acute cervical spine fracture. Electronically Signed   By: Macy Mis M.D.   On: 11/02/2021  12:20   CT Lumbar Spine Wo Contrast  Result Date: 11/02/2021 CLINICAL DATA:  Low back pain, trauma EXAM: CT LUMBAR SPINE WITHOUT CONTRAST TECHNIQUE: Multidetector CT imaging of the lumbar spine was performed without intravenous contrast administration. Multiplanar CT image reconstructions were also generated. RADIATION DOSE REDUCTION: This exam was performed according to the departmental dose-optimization program which includes automated exposure control, adjustment of the mA and/or kV according to patient size and/or use of iterative reconstruction technique. COMPARISON:  CT of the lumbar spine 12/11/2018. FINDINGS: Segmentation: Standard. Alignment: Similar mild retrolisthesis L1-L2 and grade 1 anterolisthesis of L4 on L5. Vertebrae: L1-L5 PLIF. Redemonstrated lucency around the L1 screws, suggestive of loosening. Chronic fracture of the right L5 pedicle screw. No evidence of acute fracture, although patient's osteopenia limits assessment. Paraspinal and other soft tissues: No acute findings. Calcific atherosclerosis of the aorta. Disc levels: Severe multilevel degenerative change including degenerative disc disease and facet arthropathy. IMPRESSION: 1. Osteopenia limits assessment without visible acute fracture or traumatic malalignment. 2. L1-L5 PLIF with chronic lucency around the bilateral L1 and left L5 screws, suggestive of loosening. Chronic right L5 pedicle screw fracture. Suspected chronic pseudoarthrosis at L1-L2 and L4-L5. 3. Severe multilevel degenerative change. CT myelogram or MRI could better characterize the canal/foramina clinically warranted. Electronically Signed   By: Margaretha Sheffield M.D.   On: 11/02/2021 12:26    Procedures Procedures    Medications Ordered in ED Medications - No data to display  ED Course/ Medical Decision Making/ A&P                           Medical Decision Making Amount and/or Complexity of Data Reviewed Labs: ordered. Radiology:  ordered.   76 year old female presents emergency department with multiple falls.  Mainly when trying to get out of bed for using the bathroom at night.  Described as mechanical.  No LOC/syncope.  Currently she is complaining of acute on chronic lower back pain and a mild headache.  Vitals are stable on my evaluation.  Daughter does reveal recent UTI with treatment of amoxicillin.  No other complaint of fever, chest pain, abdominal pain.  Daughters have concern that she at times appears delirious but currently she is alert and oriented x3 and appropriate during my exam.  CT of the head and neck showed no acute finding, CT of the lumbar spine show chronic diagnosis and finding.  She is pending lumbar myelogram as an outpatient.  She has had no acute neurologic change in regards to the lower extremity/lumbar spine.  Do not feel she warrants any further emergent evaluation in regards  to this.  She is ambulatory and neuro intact per her baseline.  Metabolic work-up is reassuring, urinalysis shows no UTI.  Patient's husband is at bedside and happy to take her home to follow-up at myelogram and with her outpatient primary provider.  Patient continues to be alert and oriented on my reevaluation.  Patient at this time appears safe and stable for discharge and close outpatient follow up. Discharge plan and strict return to ED precautions discussed, patient verbalizes understanding and agreement.        Final Clinical Impression(s) / ED Diagnoses Final diagnoses:  Fall, initial encounter    Rx / DC Orders ED Discharge Orders     None         Lorelle Gibbs, DO 11/02/21 1534

## 2021-11-02 NOTE — ED Notes (Signed)
Reviewed discharge instructions with patient and spouse. Follow-up care reviewed. Patient and spouse verbalized understanding. Patient A&Ox4, VSS, and ambulatory with steady gait upon discharge. 

## 2021-11-02 NOTE — ED Notes (Signed)
Pt drowsy upon assessment and keeps falling asleep and needs frequent tapping and speech to stay awake. Pt's husband reports pt hasn't been sleeping well d/t leg pain and restless legs. Pt takes Ambien and pt's husband reports it hasn't been helping. Pt is oriented x 4. She is unable to say how she keeps falling, whether it is mechanical or otherwise. Pt then started talking about "going out" sometimes and ending up in the floor. Pt denies passing out. Pt's husband reports this is the first time he is hearing about "going out". He thinks her legs are giving out. Pt reports these falls are not always at night.

## 2021-11-02 NOTE — ED Notes (Signed)
Pt transported to CT ?

## 2021-11-02 NOTE — ED Notes (Signed)
CT tech informed this RN that pt was found wandering into one of the CT rooms saying she needed her report and seemed confused. Staff attempted to ambulate pt back to hallway bed and almost fell per CT staff.

## 2021-11-02 NOTE — ED Triage Notes (Addendum)
Pt arrived POV from home c/o multiple falls. Pt states she has restless leg syndrome and has fallen out of the bed twice since last night. Pt is due to get a CT on 11/08/21.

## 2021-11-08 ENCOUNTER — Ambulatory Visit
Admission: RE | Admit: 2021-11-08 | Discharge: 2021-11-08 | Disposition: A | Discharge status: Home / Self Care | Payer: Medicare HMO | Source: Ambulatory Visit | Attending: Neurosurgery | Admitting: Neurosurgery

## 2021-11-08 DIAGNOSIS — M5416 Radiculopathy, lumbar region: Secondary | ICD-10-CM

## 2021-11-08 MED ORDER — MEPERIDINE HCL 50 MG/ML IJ SOLN
50.0000 mg | Freq: Once | INTRAMUSCULAR | Status: AC | PRN
  Administered 2021-11-08: 50 mg via INTRAMUSCULAR

## 2021-11-08 MED ORDER — IOPAMIDOL (ISOVUE-M 200) INJECTION 41%
18.0000 mL | Freq: Once | INTRAMUSCULAR | Status: AC
  Administered 2021-11-08: 18 mL via INTRATHECAL

## 2021-11-08 MED ORDER — DIAZEPAM 5 MG PO TABS
5.0000 mg | ORAL_TABLET | Freq: Once | ORAL | Status: AC
  Administered 2021-11-08: 5 mg via ORAL

## 2021-11-08 MED ORDER — ONDANSETRON HCL 4 MG/2ML IJ SOLN
4.0000 mg | Freq: Once | INTRAMUSCULAR | Status: AC | PRN
  Administered 2021-11-08: 4 mg via INTRAMUSCULAR

## 2021-11-08 NOTE — Discharge Instr - Other Orders (Addendum)
1015: Pain 9/10 post myelogram procedure, see MAR.  1032: pt reports pain is now 4/10 and is resting in nursing recovery. pts spouse at bedside.

## 2021-11-08 NOTE — Discharge Instructions (Signed)

## 2021-11-10 DIAGNOSIS — R296 Repeated falls: Secondary | ICD-10-CM | POA: Insufficient documentation

## 2021-11-21 DIAGNOSIS — L439 Lichen planus, unspecified: Secondary | ICD-10-CM | POA: Insufficient documentation

## 2021-11-21 DIAGNOSIS — G8220 Paraplegia, unspecified: Secondary | ICD-10-CM | POA: Insufficient documentation

## 2021-12-07 DIAGNOSIS — B356 Tinea cruris: Secondary | ICD-10-CM | POA: Insufficient documentation

## 2022-01-19 DIAGNOSIS — G4733 Obstructive sleep apnea (adult) (pediatric): Secondary | ICD-10-CM | POA: Insufficient documentation

## 2022-01-29 DIAGNOSIS — R55 Syncope and collapse: Secondary | ICD-10-CM | POA: Insufficient documentation

## 2022-05-01 ENCOUNTER — Other Ambulatory Visit: Payer: Self-pay | Admitting: Pain Medicine

## 2022-05-01 DIAGNOSIS — R1900 Intra-abdominal and pelvic swelling, mass and lump, unspecified site: Secondary | ICD-10-CM

## 2022-05-15 ENCOUNTER — Ambulatory Visit
Admission: RE | Admit: 2022-05-15 | Discharge: 2022-05-15 | Disposition: A | Discharge status: Home / Self Care | Payer: Medicare HMO | Source: Ambulatory Visit | Attending: Pain Medicine | Admitting: Pain Medicine

## 2022-05-15 DIAGNOSIS — R1900 Intra-abdominal and pelvic swelling, mass and lump, unspecified site: Secondary | ICD-10-CM

## 2022-05-16 ENCOUNTER — Other Ambulatory Visit: Payer: Medicare HMO

## 2022-05-24 DIAGNOSIS — G629 Polyneuropathy, unspecified: Secondary | ICD-10-CM | POA: Insufficient documentation

## 2022-10-05 DIAGNOSIS — D8989 Other specified disorders involving the immune mechanism, not elsewhere classified: Secondary | ICD-10-CM | POA: Insufficient documentation

## 2022-10-05 DIAGNOSIS — M47814 Spondylosis without myelopathy or radiculopathy, thoracic region: Secondary | ICD-10-CM | POA: Insufficient documentation

## 2022-10-05 DIAGNOSIS — E119 Type 2 diabetes mellitus without complications: Secondary | ICD-10-CM | POA: Insufficient documentation

## 2023-04-18 DIAGNOSIS — D72829 Elevated white blood cell count, unspecified: Secondary | ICD-10-CM | POA: Insufficient documentation

## 2023-08-05 ENCOUNTER — Other Ambulatory Visit: Payer: Self-pay | Admitting: Neurosurgery

## 2023-08-05 DIAGNOSIS — M47814 Spondylosis without myelopathy or radiculopathy, thoracic region: Secondary | ICD-10-CM

## 2023-08-07 ENCOUNTER — Encounter: Payer: Self-pay | Admitting: Neurosurgery

## 2023-08-19 ENCOUNTER — Encounter: Payer: Self-pay | Admitting: Neurosurgery

## 2023-08-20 ENCOUNTER — Ambulatory Visit
Admission: RE | Admit: 2023-08-20 | Discharge: 2023-08-20 | Disposition: A | Discharge status: Home / Self Care | Source: Ambulatory Visit | Attending: Neurosurgery | Admitting: Neurosurgery

## 2023-08-20 DIAGNOSIS — M47814 Spondylosis without myelopathy or radiculopathy, thoracic region: Secondary | ICD-10-CM

## 2023-09-23 ENCOUNTER — Encounter: Payer: Self-pay | Admitting: Hematology & Oncology

## 2023-10-02 ENCOUNTER — Encounter: Payer: Self-pay | Admitting: Medical Oncology

## 2023-10-02 ENCOUNTER — Inpatient Hospital Stay: Attending: Medical Oncology

## 2023-10-02 ENCOUNTER — Inpatient Hospital Stay: Admitting: Medical Oncology

## 2023-10-02 VITALS — BP 114/62 | HR 82 | Temp 98.2°F | Resp 19 | Ht 59.0 in | Wt 129.8 lb

## 2023-10-02 DIAGNOSIS — Z801 Family history of malignant neoplasm of trachea, bronchus and lung: Secondary | ICD-10-CM | POA: Insufficient documentation

## 2023-10-02 DIAGNOSIS — D72829 Elevated white blood cell count, unspecified: Secondary | ICD-10-CM

## 2023-10-02 DIAGNOSIS — R1900 Intra-abdominal and pelvic swelling, mass and lump, unspecified site: Secondary | ICD-10-CM | POA: Insufficient documentation

## 2023-10-02 LAB — CBC WITH DIFFERENTIAL (CANCER CENTER ONLY)
Abs Immature Granulocytes: 0.04 10*3/uL (ref 0.00–0.07)
Basophils Absolute: 0.1 10*3/uL (ref 0.0–0.1)
Basophils Relative: 1 %
Eosinophils Absolute: 0.2 10*3/uL (ref 0.0–0.5)
Eosinophils Relative: 1 %
HCT: 41.4 % (ref 36.0–46.0)
Hemoglobin: 13.9 g/dL (ref 12.0–15.0)
Immature Granulocytes: 0 %
Lymphocytes Relative: 28 %
Lymphs Abs: 2.9 10*3/uL (ref 0.7–4.0)
MCH: 31 pg (ref 26.0–34.0)
MCHC: 33.6 g/dL (ref 30.0–36.0)
MCV: 92.2 fL (ref 80.0–100.0)
Monocytes Absolute: 0.9 10*3/uL (ref 0.1–1.0)
Monocytes Relative: 8 %
Neutro Abs: 6.5 10*3/uL (ref 1.7–7.7)
Neutrophils Relative %: 62 %
Platelet Count: 346 10*3/uL (ref 150–400)
RBC: 4.49 MIL/uL (ref 3.87–5.11)
RDW: 13.9 % (ref 11.5–15.5)
WBC Count: 10.5 10*3/uL (ref 4.0–10.5)
nRBC: 0 % (ref 0.0–0.2)

## 2023-10-02 LAB — CMP (CANCER CENTER ONLY)
ALT: 15 U/L (ref 0–44)
AST: 17 U/L (ref 15–41)
Albumin: 4.2 g/dL (ref 3.5–5.0)
Alkaline Phosphatase: 71 U/L (ref 38–126)
Anion gap: 6 (ref 5–15)
BUN: 21 mg/dL (ref 8–23)
CO2: 30 mmol/L (ref 22–32)
Calcium: 10 mg/dL (ref 8.9–10.3)
Chloride: 106 mmol/L (ref 98–111)
Creatinine: 1 mg/dL (ref 0.44–1.00)
GFR, Estimated: 58 mL/min — ABNORMAL LOW (ref >60)
Glucose, Bld: 107 mg/dL — ABNORMAL HIGH (ref 70–99)
Potassium: 5.1 mmol/L (ref 3.5–5.1)
Sodium: 142 mmol/L (ref 135–145)
Total Bilirubin: 0.4 mg/dL (ref 0.0–1.2)
Total Protein: 7.2 g/dL (ref 6.5–8.1)

## 2023-10-02 LAB — SAVE SMEAR(SSMR), FOR PROVIDER SLIDE REVIEW

## 2023-10-02 LAB — LACTATE DEHYDROGENASE: LDH: 149 U/L (ref 98–192)

## 2023-10-02 NOTE — Progress Notes (Signed)
 [facility] [phone]   [phone]  INITIAL CONSULT NOTE  Patient Care Team: Valerie Gather, MD as PCP - General (Family Medicine)  CHIEF COMPLAINTS/PURPOSE OF CONSULTATION:  Elevated White Blood Cells  HISTORY OF PRESENTING ILLNESS:  [name] 78 y.o. female is referred to our office by Ahmed Hough NP for elevated white blood cell count:  This finding has occurred over the past 2 years. Mild in nature. Recent labs to accompany referral show a WBC of 15.7, Hgb 14.2, platelets 443, ANC 10.6, Monocytes 1.256.   Signs of infection: No Recent illness: No Sick contacts: No Recent travel: No Fever: No Night Sweats: Every once in a while Weight loss: No Obesity: Yes Bleeding/bruising: Yes- arms- provoked  Fatigue: Yes at the end of the day. 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  Malignancies: No Sickle Cell Disease: No Smoking: No- never smoker  Vigorous exercise: No Stress: Comes and goes  Asplenia: No Pregnancy: Not current Thyroid disease: No Family History Leukocytosis: No Occupational hazards to benzene, pesticides, industrial chemicals: No History of chemotherapy/radiation: No Pruritus: No Cough: No Rash: No SOB/Chest pain/Dyspnea/Peripheral edema: (eosinophilic myocarditis):No  She is not on any steroids No known exposures to HIV/Hepatitis C/B, TB She is UTD on colon cancer screenings and mammograms  Wt Readings from Last 3 Encounters:  10/02/23 129 lb 12.8 oz (58.9 kg)  11/02/21 150 lb (68 kg)  05/02/21 137 lb (62.1 kg)    MEDICAL HISTORY:  Past Medical History:  Diagnosis Date   Arthritis    Blood transfusion without reported diagnosis    Cataract    removed bilaterally   Complication of anesthesia    "slow to wake up"   GERD (gastroesophageal reflux disease)    Hyperlipidemia    Hypertension    Neuromuscular disorder (HCC)    nerve pain  in feet   Sleep apnea    no cpap   Spinal stenosis     SURGICAL HISTORY: Past Surgical History:  Procedure Laterality Date   ACHILLES TENDON REPAIR     right   ANTERIOR LAT LUMBAR FUSION Right 11/13/2013   Procedure: Extreme Lateral Lumbar Fusion L1-2, L2-3, L3-4;  Surgeon: Isadora Mar, MD;  Location: [facility];  Service: Neurosurgery;  Laterality: Right;  Extreme Lateral Lumbar Fusion L1-2, L2-3, L3-4   CARPAL TUNNEL RELEASE Bilateral    CATARACT EXTRACTION, BILATERAL     with lens implants   COLONOSCOPY     last 04-12-2011   LUMBAR LAMINECTOMY/DECOMPRESSION MICRODISCECTOMY N/A 07/30/2012   Procedure: Lumbar Decompression L1 - S1 (X-Ray)4 LEVEL;  Surgeon: Loel Ring, MD;  Location: [facility];  Service: Orthopedics;  Laterality: N/A;  Lumbar Decompression L1 - S1 (X-Ray)   MAXIMUM ACCESS (MAS)POSTERIOR LUMBAR INTERBODY FUSION (PLIF) 1 LEVEL N/A 11/13/2013   Procedure: FOR MAXIMUM ACCESS (MAS) POSTERIOR LUMBAR INTERBODY FUSION (PLIF) 1 LEVEL;  Surgeon: Isadora Mar, MD;  Location: [facility];  Service: Neurosurgery;  Laterality: N/A;  FOR MAXIMUM ACCESS (MAS) POSTERIOR LUMBAR INTERBODY FUSION (PLIF) 1 LEVEL L4-L5, Cortical Screws Lumbar 1-5    SOCIAL HISTORY: Social History   Socioeconomic History   Marital status: Married    Spouse name: Not on file   Number of children: Not on file   Years of education: Not on file   Highest education level: Not on file  Occupational History   Not on file  Tobacco Use   Smoking status:  Never   Smokeless tobacco: Never  Vaping Use   Vaping status: Never Used  Substance and Sexual Activity   Alcohol use: No   Drug use: No   Sexual activity: Not on file  Other Topics Concern   Not on file  Social History Narrative   Not on file   Social Drivers of Health   Financial Resource Strain: Not on file  Food Insecurity: No Food Insecurity (10/02/2023)   Hunger Vital Sign    Worried About Running Out of Food in the Last Year: Never  true    Ran Out of Food in the Last Year: Never true  Transportation Needs: No Transportation Needs (10/02/2023)   PRAPARE - Administrator, Civil Service (Medical): No    Lack of Transportation (Non-Medical): No  Physical Activity: Not on file  Stress: Not on file  Social Connections: Not on file  Intimate Partner Violence: Not At Risk (10/02/2023)   Humiliation, Afraid, Rape, and Kick questionnaire    Fear of Current or Ex-Partner: No    Emotionally Abused: No    Physically Abused: No    Sexually Abused: No    FAMILY HISTORY: Family History  Problem Relation Age of Onset   Lung cancer Mother        aruond age 65- she did smoke   Lung cancer Sister        diagnosed around age 43   Prostate cancer Brother    Lung cancer Maternal Aunt    Colon cancer Neg Hx    Colon polyps Neg Hx    Esophageal cancer Neg Hx    Rectal cancer Neg Hx    Stomach cancer Neg Hx     ALLERGIES:  is allergic to percocet [oxycodone-acetaminophen ].  MEDICATIONS:  Current Outpatient Medications  Medication Sig Dispense Refill   aspirin  EC 81 MG tablet Take 81 mg by mouth daily. States last dose will be 07/23/12     Calcium Citrate-Vitamin D 200-3.125 MG-MCG TABS Take 1 tablet by mouth daily.     Coenzyme Q10 (CO Q 10 PO) Take 120 mg by mouth daily.     lisinopril  (PRINIVIL ,ZESTRIL ) 5 MG tablet Take 5 mg by mouth daily.      metFORMIN (GLUCOPHAGE) 500 MG tablet Take 500 mg by mouth 2 (two) times daily.     Multiple Vitamin (MULTIVITAMIN) tablet Take 1 tablet by mouth daily.     omeprazole (PRILOSEC) 40 MG capsule Take 40 mg by mouth daily.     ondansetron  (ZOFRAN ) 4 MG tablet Take 4 mg by mouth daily as needed for nausea.     pravastatin (PRAVACHOL) 80 MG tablet Take 80 mg by mouth daily.     pregabalin (LYRICA) 50 MG capsule Take 50 mg by mouth 2 (two) times daily.     rOPINIRole (REQUIP) 0.5 MG tablet Take 0.5 mg by mouth at bedtime.     venlafaxine XR (EFFEXOR-XR) 150 MG 24 hr capsule  Take 150 mg by mouth daily.     zolpidem  (AMBIEN ) 10 MG tablet Use one-half to one by mouth at night prn insomnia.     No current facility-administered medications for this visit.    REVIEW OF SYSTEMS:   Constitutional: ( - ) fevers, ( - )  chills , ( - ) night sweats Eyes: ( - ) blurriness of vision, ( - ) double vision, ( - ) watery eyes Ears, nose, mouth, throat, and face: ( - ) mucositis, ( - ) sore  throat Respiratory: ( - ) cough, ( - ) dyspnea, ( - ) wheezes Cardiovascular: ( - ) palpitation, ( - ) chest discomfort, ( - ) lower extremity swelling Gastrointestinal:  ( - ) nausea, ( - ) heartburn, ( - ) change in bowel habits Skin: ( - ) abnormal skin rashes Lymphatics: ( - ) new lymphadenopathy, ( - ) easy bruising Neurological: ( - ) numbness, ( - ) tingling, ( - ) new weaknesses Behavioral/Psych: ( - ) mood change, ( - ) new changes  All other systems were reviewed with the patient and are negative.  PHYSICAL EXAMINATION: ECOG PERFORMANCE STATUS: 0 - Asymptomatic  Vitals:   10/02/23 1318  BP: 114/62  Pulse: 82  Resp: 19  Temp: 98.2 F (36.8 C)  SpO2: 95%   Filed Weights   10/02/23 1318  Weight: 129 lb 12.8 oz (58.9 kg)    GENERAL: well appearing female in NAD  SKIN: skin color, texture, turgor are normal, no rashes or significant lesions EYES: conjunctiva are pink and non-injected, sclera clear OROPHARYNX: no exudate, no erythema; lips, buccal mucosa, and tongue normal  NECK: supple, non-tender LYMPH:  no palpable lymphadenopathy in the cervical, axillary or supraclavicular lymph nodes.  LUNGS: clear to auscultation and percussion with normal breathing effort HEART: regular rate & rhythm and no murmurs and no lower extremity edema ABDOMEN: soft, non-tender, non-distended, normal bowel sounds Musculoskeletal: no cyanosis of digits and no clubbing  PSYCH: alert & oriented x 3, fluent speech NEURO: no focal motor/sensory deficits  LABORATORY DATA:  Pending    BLOOD FILM: Pending  ASSESSMENT & PLAN [name] is a 78 y.o. caucasian female who was referred to us  for Leukocytosis:  We discussed that leukocytosis is an elevation of the WBC of the body. This condition is fairly common and there are many various potential causes. Often times leukocytosis resolves on its own without any intervention. I suspect that hers is due to inflammation secondary to her thoracic spondylosis/arthritis. Obtaining labs today to help aid in work up (CBC w/, CMP, LDH, blood smear, BCR-ABL).   There are different types of WBCs and elevations in each can help determine its potential cause. For example, eosinophilia can indicate an allergic response or response to parasitic infections/chronic disease. Lymphocytosis can be from viral syndromes or autoimmune causes. Elevated neutrophil count is the most common form of leukocytosis. About 2.5% of the population naturally falls outside of the standard range. Significant elevations, 2 times above the standard deviation often indicate a bacterial infection. Acute and chronic inflammation due to conditions like rheumatic diseases, Kawasaki disease, adult-onset Still disease, inflammatory bowel disease, and chronic hepatitis are common causes. Additionally, myeloproliferative neoplasms, asplenia, cigarette smoking, stress, pregnancy, obesity, thyroid disorders, Down syndrome, nonhematologic malignancies, and medications like glucocorticoids, catecholamines, and lithium are potential causes.   A leukemoid reaction is a transient increase in WBC count marked by a neutrophil count of >50,000 cells/L without a myeloproliferative neoplasm. Medications, asplenia, nonhematologic malignancies, and infection with Costridioides difficile, tuberculosis, pertussis, and visceral larva migrans can cause a leukemoid reaction. This acute inflammatory reaction can be mistaken for leukemia, but careful history, physical examination, and further  laboratory evaluation can confirm the diagnosis. Peripheral smears and radiological imaging may be necessary to identify the actual cause of these reactive laboratory findings.[3] This diagnosis must be differentiated from leukemia, defined as increases in blast cells, precursor cells to leukocytes, and immature WBCs rather than mature neutrophils seen in a leukemoid reaction. A leukemoid reaction  improves after treating the underlying cause of the neutrophilia, whereas leukemia continues to demonstrate elevated WBCs until the completion of definitive treatment.   Hypersensitivity reactions, leukemia, stress, asplenia, thymoma, and lymphoma may cause lymphocytosis. Infectious causes of lymphocytosis are generally viral, like EBV, cytomegalovirus, influenza, measles, mumps, rubella, adenovirus, and Coxsackie virus. Bacterial infections like pertussis and cat scratch disease cause lymphocytosis. Additional possible infectious causes are tuberculosis, brucellosis, babesiosis, and syphilis.   Eosinophils account for approximately 1% to 4% of a person's leukocytes, and an eosinophil count >500 cells/L defines eosinophilia. The list of potential underlying causes of eosinophilia is extensive. Allergic conditions like allergic rhinitis and atopic dermatitis are common causes and are generally associated with mild eosinophilia. Patients with severe eosinophilia, >=20,000 cells/L, are more likely to have a myeloid neoplasm. Besides these 2 extremes, the level of eosinophilia does not help distinguish the underlying cause.  Infectious causes of eosinophilia include helminths, fungi, protozoa, some bacteria, HIV, human T-cell lymphotropic virus type 1, and scabies.[6][7] Nearly any medication reaction can cause eosinophilia. However, nonsteroidal anti-inflammatory (NSAID) medications, allopurinol, phenytoin, penicillins, cephalosporins, and sulfasalazine are some of the more commonly known medications associated with  specific syndromes involving eosinophilia. Additional causes of eosinophilia are rheumatologic diseases like eosinophilic granulomatosis with polyangiitis, adrenal insufficiency, and immunodeficiency syndromes.  Work up often includes labs to address potential causes and monitoring for abrupt significant changes to lab values. For persistent/significant leukocytosis, a bone marrow biopsy is typically recommended.   Leukoerythroblastosis typically suggests a myelophthisic process, where normal marrow space is infiltrated and replaced by nonhematopoietic or abnormal cells. The peripheral smear reveals leukocytosis with immature erythroid, myeloid, and blast cells in the peripheral blood. Other potential causes are cytokine release, such as severe acute respiratory syndrome coronavirus 2 (SARS-CoV-2), direct myelotoxicity, and viral infection  Transient basophilia is a reactive response, especially to an acute viral illness. Persistent basophilia on serial CBCs for longer than 8 weeks suggests an underlying malignancy or myeloproliferative disease.  All questions were answered. The patient knows to call the clinic with any problems, questions or concerns.  I have spent a total of 60 minutes minutes of face-to-face and non-face-to-face time, preparing to see the patient, obtaining and/or reviewing separately obtained history, performing a medically appropriate examination, counseling and educating the patient, ordering medications/tests/procedures, referring and communicating with other health care professionals, documenting clinical information in the electronic health record, independently interpreting results and communicating results to the patient, and care coordination.   RTC 1 month APP, labs (CBC w/, CMP, LDH)   Sunnie England PA-C Department of Hematology/Oncology [facility] at [facility]

## 2023-10-06 LAB — BCR-ABL1, CML/ALL, PCR, QUANT
E1A2 Transcript: <0.0032 %
Interpretation (BCRAL):: NEGATIVE
b2a2 transcript: <0.0032 %
b3a2 transcript: <0.0032 %

## 2023-10-09 ENCOUNTER — Encounter: Payer: Self-pay | Admitting: Medical Oncology

## 2023-10-26 IMAGING — XA DG MYELOGRAPHY LUMBAR INJ LUMBOSACRAL
12 of 17 series · 12 of 17 positions shown · non-contrast
Comparison: Lumbar spine myelogram 12/11/2018 and 10/07/2013.

CLINICAL DATA: Low back pain extending into the left lower
extremity.
TECHNIQUE: Contiguous axial images were obtained through the Lumbar spine after
the intrathecal infusion of infusion. Coronal and sagittal
reconstructions were obtained of the axial image sets.

[Series 1: w lumbar spine lat · 0.15mm/px · 1 of 1 slices shown]
[im 1/1]
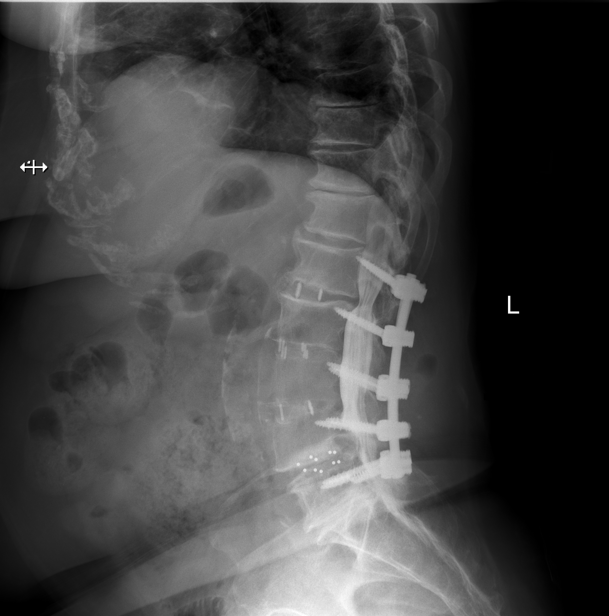

[Series 2: vasc adipose · 1 of 1 slices shown (1 of 10)]
[im 1/1]
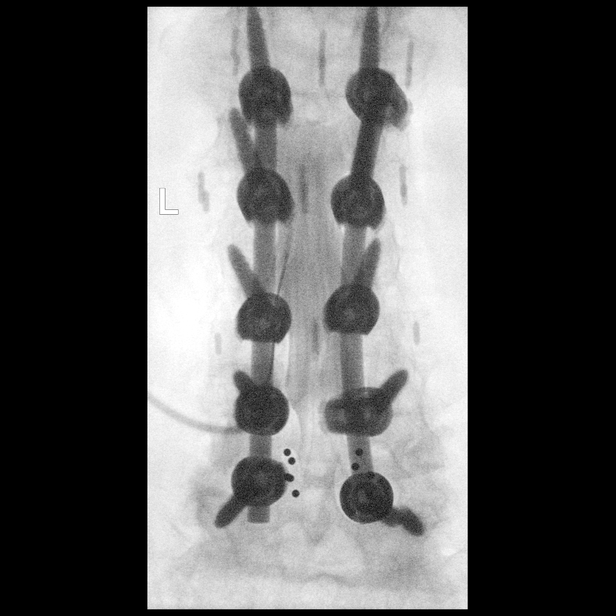

[Series 2: w lumbar spine flexion · 0.15mm/px · 1 of 1 slices shown]
[im 1/1]
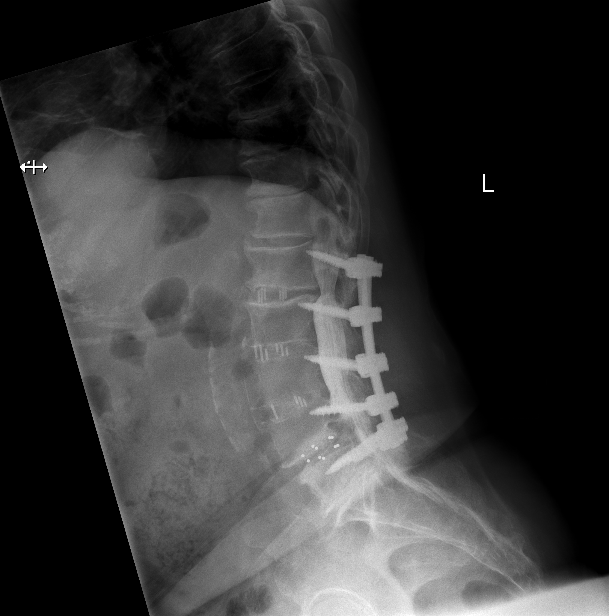

[Series 3: vasc adipose · 1 of 1 slices shown (2 of 10)]
[im 1/1]
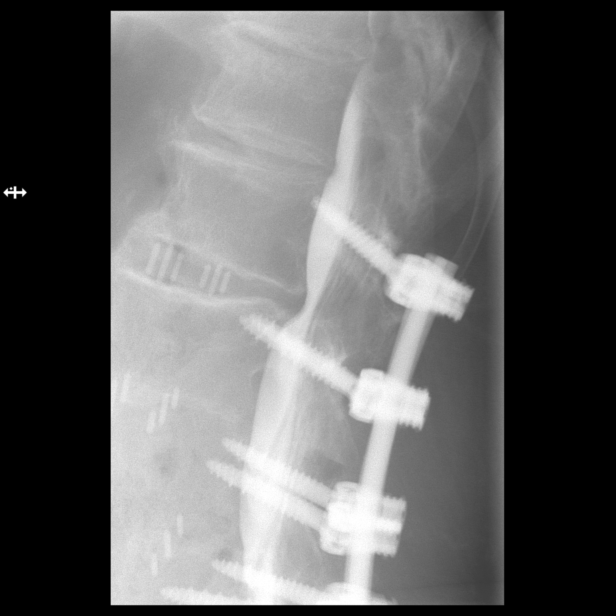

[Series 4: vasc adipose · 1 of 1 slices shown (3 of 10)]
[im 1/1]
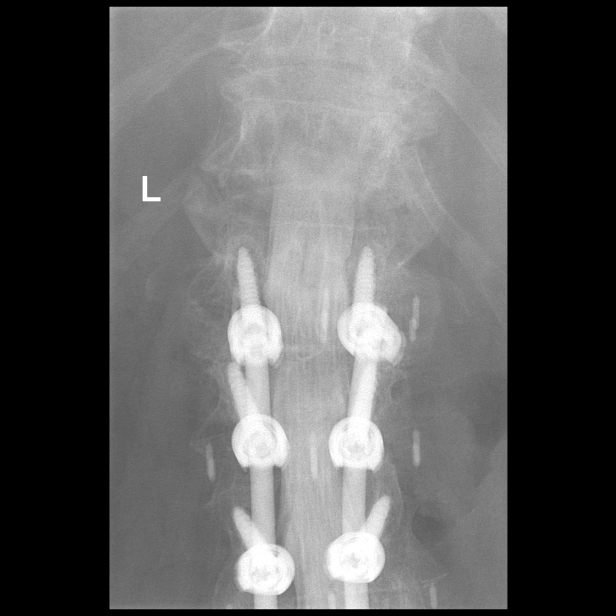

[Series 5: vasc adipose · 1 of 1 slices shown (4 of 10)]
[im 1/1]
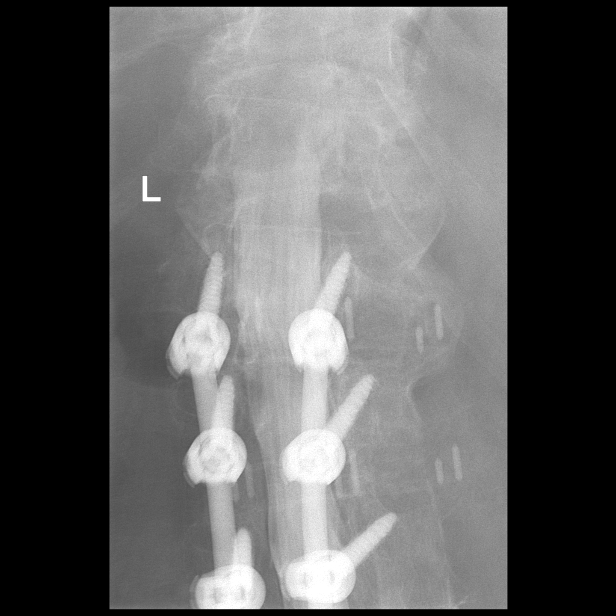

[Series 7: vasc adipose · 1 of 1 slices shown (5 of 10)]
[im 1/1]
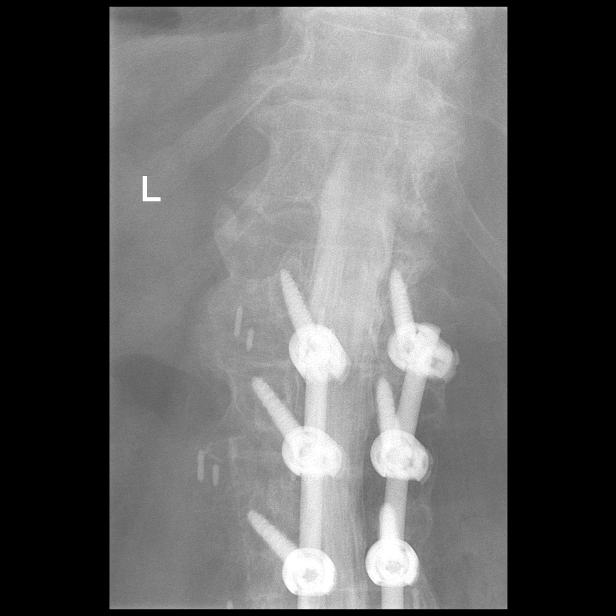

[Series 8: vasc adipose · 1 of 1 slices shown (6 of 10)]
[im 1/1]
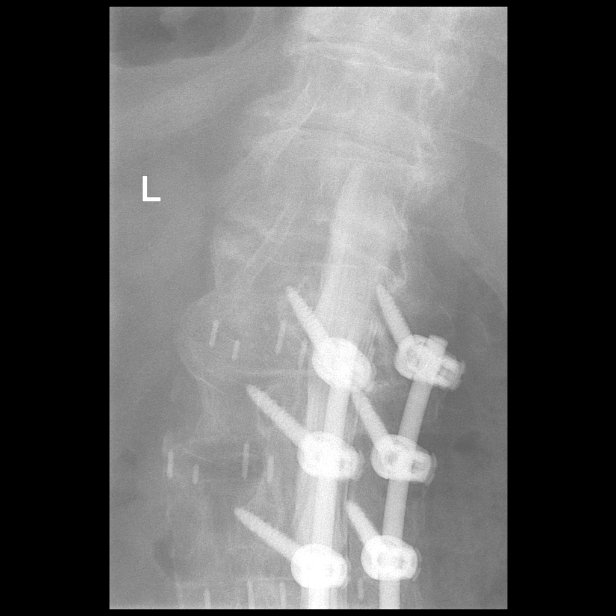

[Series 10: vasc adipose · 1 of 1 slices shown (7 of 10)]
[im 1/1]
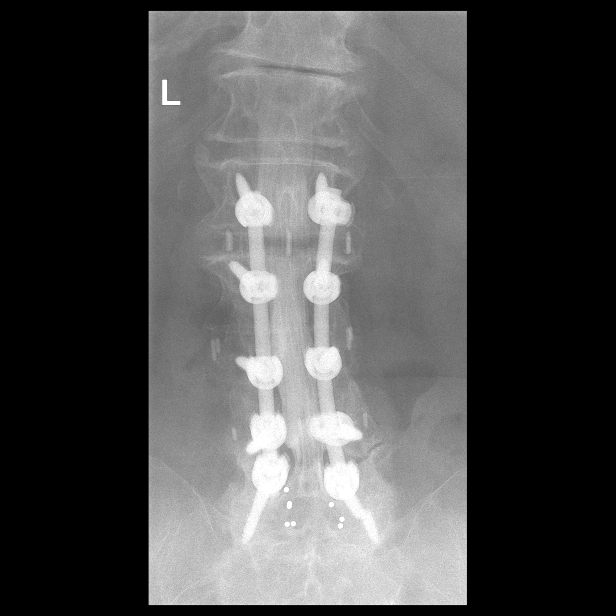

[Series 12: vasc adipose · 1 of 1 slices shown (8 of 10)]
[im 1/1]
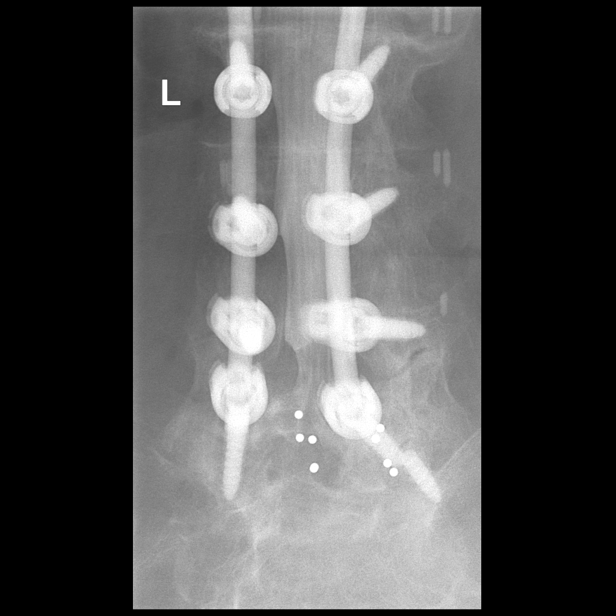

[Series 13: vasc adipose · 1 of 1 slices shown (9 of 10)]
[im 1/1]
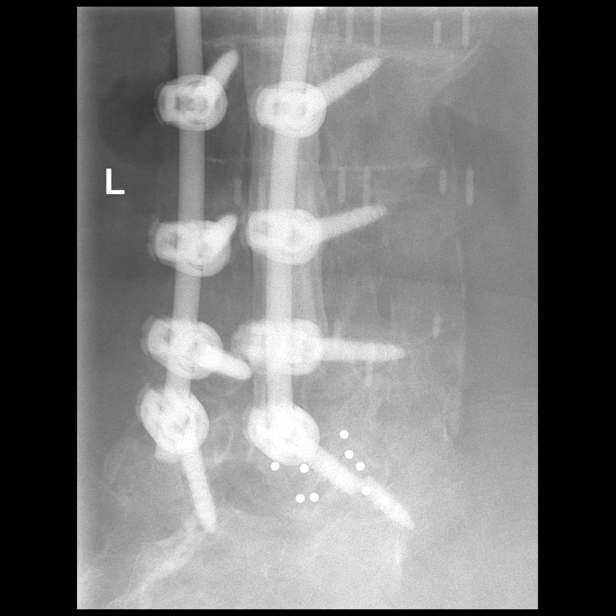

[Series 15: vasc adipose · 1 of 1 slices shown (10 of 10)]
[im 1/1]
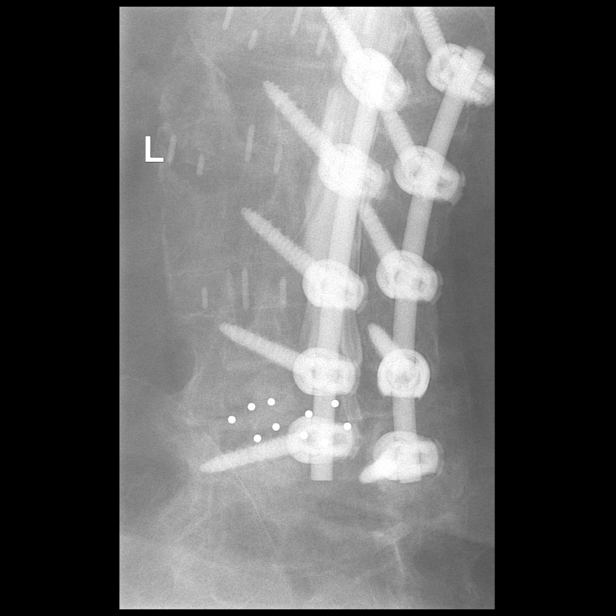

[12 of 17 positions shown; findings below may reference images not displayed]

EXAM:
LUMBAR MYELOGRAM

FLUOROSCOPY:
Radiation Exposure Index (as provided by the fluoroscopic device):
Dose area product 1119.52 uGy*m2

PROCEDURE:
After thorough discussion of risks and benefits of the procedure
including bleeding, infection, injury to nerves, blood vessels,
adjacent structures as well as headache and CSF leak, written and
oral informed consent was obtained. Consent was obtained by Dr.
Arcinio Brabo. Time out form was completed.

Patient was positioned prone on the fluoroscopy table. Local
anesthesia was provided with 1% lidocaine without epinephrine after
prepped and draped in the usual sterile fashion. Puncture was
performed at L3 using a 3 1/2 inch 22-gauge spinal needle via
midline approach. Using a single pass through the dura, the needle
was placed within the thecal sac, with return of clear CSF. 15 mL of
Isovue Y-4WW was injected into the thecal sac, with normal
opacification of the nerve roots and cauda equina consistent with
free flow within the subarachnoid space.

I personally performed the lumbar puncture and administered the
intrathecal contrast. I also personally supervised acquisition of
the myelogram images.
FINDINGS: LUMBAR MYELOGRAM FINDINGS:

Pedicle screw and rod fixation again noted at L1-2, L2-3, L3-4 and
L4-5. Hardware is stable. Remote right L5 pedicle screw fracture
again noted. Ventral impression and moderate central canal stenosis
has progressed at the L4-5 level. Moderate central canal stenosis
has progressed. Mild to moderate central canal narrowing at L2-3 is
stable.

No significant motion is present with flexion or extension.

Chronic loss of disc height present at L5-S1. Endplate sclerosis has
progressed at this level.

CT LUMBAR MYELOGRAM FINDINGS:

Lumbar spine is imaged from midbody of T12 through S2-3. Lucency
about the L1 pedicle screws, right greater than left, is stable.
Lucency about the L5 pedicle screws remains, left greater than
right. The right L5 pedicle screw is fractured.

Atherosclerotic calcifications are again present within the
abdominal aorta. No aneurysm is present. Visualized solid organs are
within normal limits. No significant adenopathy is present.

Conus terminates at L1.

T12-L1: Negative.

L1-2: A broad-based disc protrusion effaces the ventral CSF. Some
crowding of nerve roots is noted. Moderate foraminal stenosis
bilaterally demonstrates slight progression. Anterior fusion is
solid.

L2-3: Anterior fusion is solid. Wide laminectomy noted. No
significant residual or recurrent stenosis is present.

L3-4: Anterior fusion is solid. Wide laminectomy is present. No
residual or recurrent stenosis is present.

L4-5: Bridging bone is present anteriorly. A prominent ventral
impression on the thecal sac is new from the prior exam. This
results in subarticular narrowing, left greater than right. Moderate
foraminal stenosis is stable, right greater than left.

L5-S1: Advanced facet hypertrophy is present. Chronic loss of disc
height and endplate spurring is noted. Moderate foraminal stenosis
is worse right than left.
IMPRESSION: 1. Progressive moderate central canal stenosis at L4-5, left greater
than right. New ventral impression is present. Question recurrent
disc protrusion. This seems less likely following discectomy. This
could represent a synovial cyst. Granulation tissue would not be
expected to create mass effect on the ventral CSF.
2. Moderate foraminal stenosis bilaterally at L5-S1 is worse right
than left.
3. Solid anterior fusion at L1-2, L2-3, L3-4 and L4-5 without
significant residual or recurrent stenosis at these levels.
4. Lucency about the L1 pedicle screws, right greater than left, is
stable.
5. Lucency about the L5 pedicle screws remains, left greater than
right. The right L5 pedicle screw is fractured.
6. Moderate foraminal stenosis bilaterally at L1-2 demonstrates
slight progression.
7. Moderate foraminal stenosis at L4-5 is stable, right greater than
left.
8. Aortic Atherosclerosis (VCL0X-YJB.B).

## 2023-10-26 IMAGING — CT CT L SPINE W/ CM
1 of 7 series · 6 of 14 positions shown, 8 images · non-contrast
Comparison: Lumbar spine myelogram 12/11/2018 and 10/07/2013.

CLINICAL DATA: Low back pain extending into the left lower
extremity.
TECHNIQUE: Contiguous axial images were obtained through the Lumbar spine after
the intrathecal infusion of infusion. Coronal and sagittal
reconstructions were obtained of the axial image sets.

[Series 3: l spine soft · axial · 0.33mm/px · z∈[-493,-351]mm · 6 of 101 slices shown, 8 images]
[im 15/101  soft-tissue]
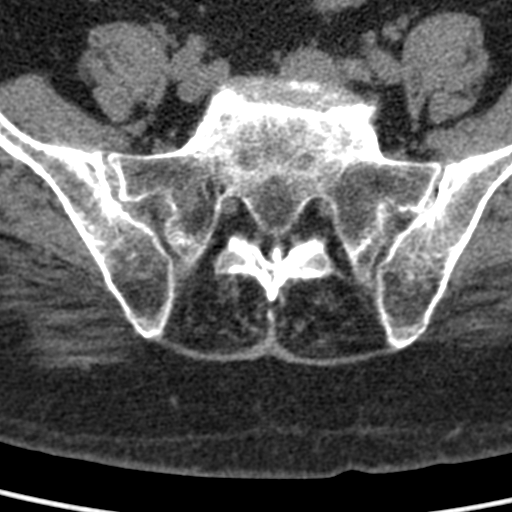
[im 15/101  bone]
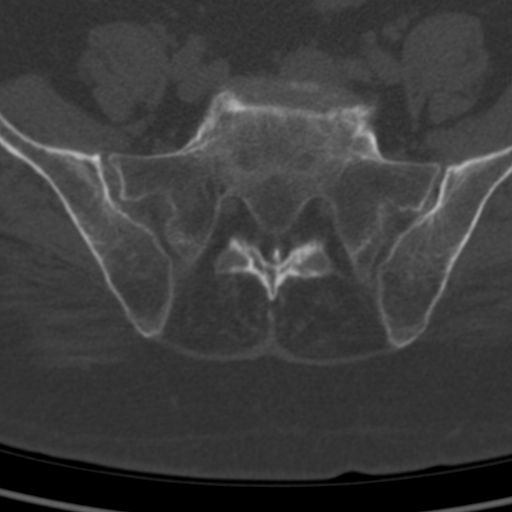
[im 29/101  bone]
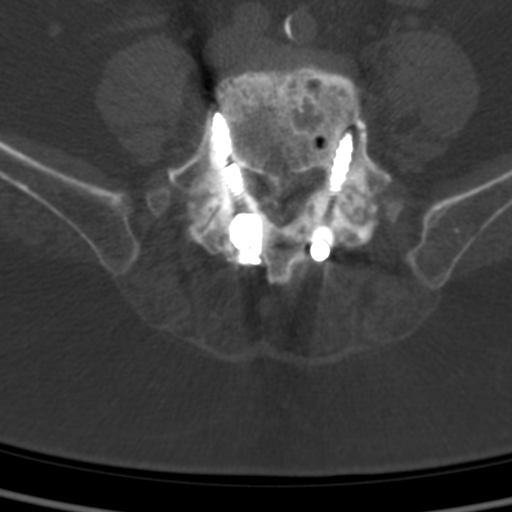
[im 43/101  bone]
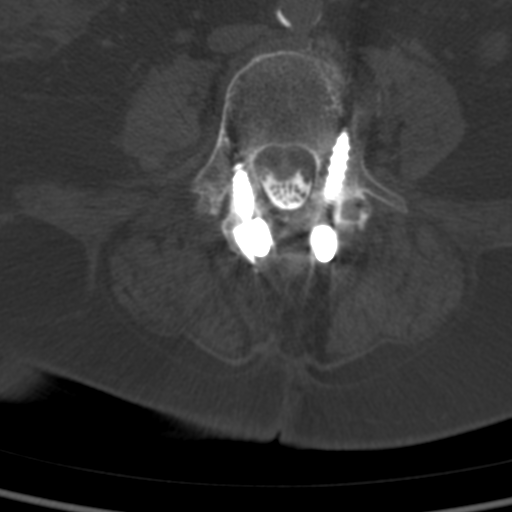
[im 58/101  bone]
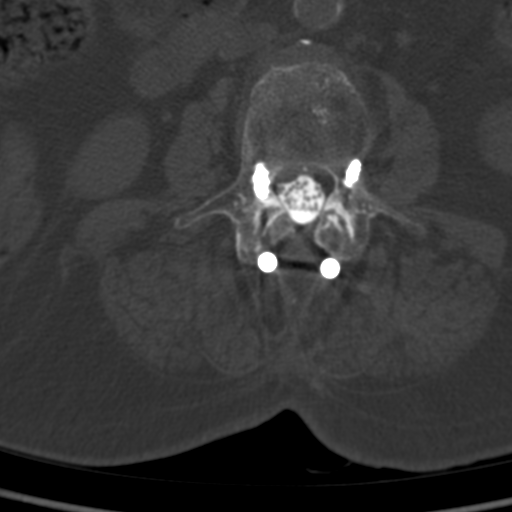
[im 72/101  soft-tissue]
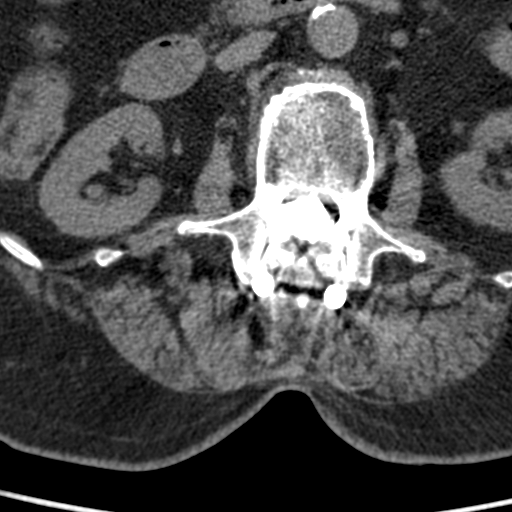
[im 72/101  bone]
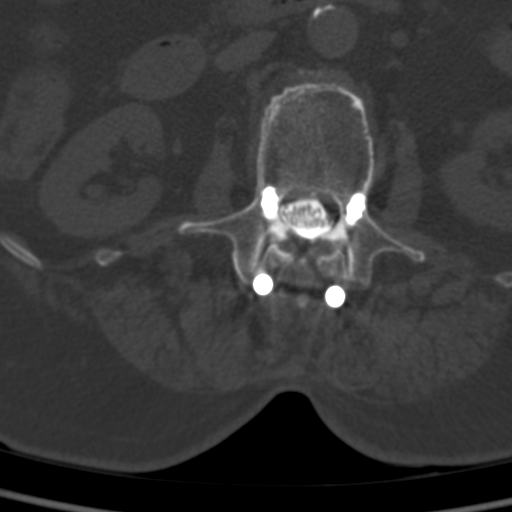
[im 86/101  bone]
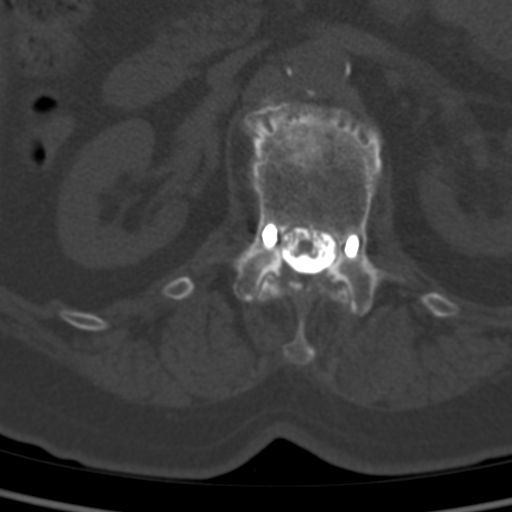

[6 of 14 positions shown; findings below may reference images not displayed]

EXAM:
LUMBAR MYELOGRAM

FLUOROSCOPY:
Radiation Exposure Index (as provided by the fluoroscopic device):
Dose area product 1119.52 uGy*m2

PROCEDURE:
After thorough discussion of risks and benefits of the procedure
including bleeding, infection, injury to nerves, blood vessels,
adjacent structures as well as headache and CSF leak, written and
oral informed consent was obtained. Consent was obtained by Dr.
Arcinio Brabo. Time out form was completed.

Patient was positioned prone on the fluoroscopy table. Local
anesthesia was provided with 1% lidocaine without epinephrine after
prepped and draped in the usual sterile fashion. Puncture was
performed at L3 using a 3 1/2 inch 22-gauge spinal needle via
midline approach. Using a single pass through the dura, the needle
was placed within the thecal sac, with return of clear CSF. 15 mL of
Isovue Y-[id number] was injected into the thecal sac, with normal
opacification of the nerve roots and cauda equina consistent with
free flow within the subarachnoid space.

I personally performed the lumbar puncture and administered the
intrathecal contrast. I also personally supervised acquisition of
the myelogram images.
FINDINGS: LUMBAR MYELOGRAM FINDINGS:

Pedicle screw and rod fixation again noted at L1-2, L2-3, L3-4 and
L4-5. Hardware is stable. Remote right L5 pedicle screw fracture
again noted. Ventral impression and moderate central canal stenosis
has progressed at the L4-5 level. Moderate central canal stenosis
has progressed. Mild to moderate central canal narrowing at L2-3 is
stable.

No significant motion is present with flexion or extension.

Chronic loss of disc height present at L5-S1. Endplate sclerosis has
progressed at this level.

CT LUMBAR MYELOGRAM FINDINGS:

Lumbar spine is imaged from midbody of T12 through S2-3. Lucency
about the L1 pedicle screws, right greater than left, is stable.
Lucency about the L5 pedicle screws remains, left greater than
right. The right L5 pedicle screw is fractured.

Atherosclerotic calcifications are again present within the
abdominal aorta. No aneurysm is present. Visualized solid organs are
within normal limits. No significant adenopathy is present.

Conus terminates at L1.

T12-L1: Negative.

L1-2: A broad-based disc protrusion effaces the ventral CSF. Some
crowding of nerve roots is noted. Moderate foraminal stenosis
bilaterally demonstrates slight progression. Anterior fusion is
solid.

L2-3: Anterior fusion is solid. Wide laminectomy noted. No
significant residual or recurrent stenosis is present.

L3-4: Anterior fusion is solid. Wide laminectomy is present. No
residual or recurrent stenosis is present.

L4-5: Bridging bone is present anteriorly. A prominent ventral
impression on the thecal sac is new from the prior exam. This
results in subarticular narrowing, left greater than right. Moderate
foraminal stenosis is stable, right greater than left.

L5-S1: Advanced facet hypertrophy is present. Chronic loss of disc
height and endplate spurring is noted. Moderate foraminal stenosis
is worse right than left.
IMPRESSION: 1. Progressive moderate central canal stenosis at L4-5, left greater
than right. New ventral impression is present. Question recurrent
disc protrusion. This seems less likely following discectomy. This
could represent a synovial cyst. Granulation tissue would not be
expected to create mass effect on the ventral CSF.
2. Moderate foraminal stenosis bilaterally at L5-S1 is worse right
than left.
3. Solid anterior fusion at L1-2, L2-3, L3-4 and L4-5 without
significant residual or recurrent stenosis at these levels.
4. Lucency about the L1 pedicle screws, right greater than left, is
stable.
5. Lucency about the L5 pedicle screws remains, left greater than
right. The right L5 pedicle screw is fractured.
6. Moderate foraminal stenosis bilaterally at L1-2 demonstrates
slight progression.
7. Moderate foraminal stenosis at L4-5 is stable, right greater than
left.
8. Aortic Atherosclerosis (VCL0X-YJB.B).

## 2023-10-30 ENCOUNTER — Inpatient Hospital Stay (HOSPITAL_BASED_OUTPATIENT_CLINIC_OR_DEPARTMENT_OTHER): Admitting: Medical Oncology

## 2023-10-30 ENCOUNTER — Inpatient Hospital Stay: Attending: Medical Oncology

## 2023-10-30 ENCOUNTER — Encounter: Payer: Self-pay | Admitting: Medical Oncology

## 2023-10-30 VITALS — BP 114/70 | HR 87 | Temp 97.7°F | Resp 18 | Ht 59.0 in | Wt 130.1 lb

## 2023-10-30 DIAGNOSIS — D72829 Elevated white blood cell count, unspecified: Secondary | ICD-10-CM | POA: Diagnosis present

## 2023-10-30 DIAGNOSIS — M479 Spondylosis, unspecified: Secondary | ICD-10-CM | POA: Diagnosis not present

## 2023-10-30 DIAGNOSIS — M2550 Pain in unspecified joint: Secondary | ICD-10-CM | POA: Diagnosis not present

## 2023-10-30 LAB — CMP (CANCER CENTER ONLY)
ALT: 13 U/L (ref 0–44)
AST: 16 U/L (ref 15–41)
Albumin: 4.3 g/dL (ref 3.5–5.0)
Alkaline Phosphatase: 85 U/L (ref 38–126)
Anion gap: 8 (ref 5–15)
BUN: 12 mg/dL (ref 8–23)
CO2: 28 mmol/L (ref 22–32)
Calcium: 9.5 mg/dL (ref 8.9–10.3)
Chloride: 105 mmol/L (ref 98–111)
Creatinine: 0.82 mg/dL (ref 0.44–1.00)
GFR, Estimated: >60 mL/min (ref >60)
Glucose, Bld: 109 mg/dL — ABNORMAL HIGH (ref 70–99)
Potassium: 4.5 mmol/L (ref 3.5–5.1)
Sodium: 141 mmol/L (ref 135–145)
Total Bilirubin: 0.4 mg/dL (ref 0.0–1.2)
Total Protein: 6.6 g/dL (ref 6.5–8.1)

## 2023-10-30 LAB — CBC WITH DIFFERENTIAL (CANCER CENTER ONLY)
Abs Immature Granulocytes: 0.04 10*3/uL (ref 0.00–0.07)
Basophils Absolute: 0 10*3/uL (ref 0.0–0.1)
Basophils Relative: 0 %
Eosinophils Absolute: 0.2 10*3/uL (ref 0.0–0.5)
Eosinophils Relative: 2 %
HCT: 41.1 % (ref 36.0–46.0)
Hemoglobin: 13.6 g/dL (ref 12.0–15.0)
Immature Granulocytes: 0 %
Lymphocytes Relative: 32 %
Lymphs Abs: 3.3 10*3/uL (ref 0.7–4.0)
MCH: 30.3 pg (ref 26.0–34.0)
MCHC: 33.1 g/dL (ref 30.0–36.0)
MCV: 91.5 fL (ref 80.0–100.0)
Monocytes Absolute: 0.9 10*3/uL (ref 0.1–1.0)
Monocytes Relative: 9 %
Neutro Abs: 5.7 10*3/uL (ref 1.7–7.7)
Neutrophils Relative %: 57 %
Platelet Count: 389 10*3/uL (ref 150–400)
RBC: 4.49 MIL/uL (ref 3.87–5.11)
RDW: 13.8 % (ref 11.5–15.5)
WBC Count: 10.2 10*3/uL (ref 4.0–10.5)
nRBC: 0 % (ref 0.0–0.2)

## 2023-10-30 LAB — LACTATE DEHYDROGENASE: LDH: 139 U/L (ref 98–192)

## 2023-10-30 NOTE — Progress Notes (Signed)
 Hematology and Oncology Follow Up Visit  [name] [id number] 1946/05/05 78 y.o. 10/30/2023  Past Medical History:  Diagnosis Date   Arthritis    Blood transfusion without reported diagnosis    Cataract    removed bilaterally   Complication of anesthesia    "slow to wake up"   GERD (gastroesophageal reflux disease)    Hyperlipidemia    Hypertension    Neuromuscular disorder (HCC)    nerve pain in feet   Sleep apnea    no cpap   Spinal stenosis     Principle Diagnosis:  Leukocytosis  Current Therapy:   Observation     Interim History:  [name] is back for follow-up for Leukocytosis..   At her initial visit on 10/02/2023 we discussed that I suspected her elevation in WBC was secondary to her thoracic spondylosis/arthritis. We did proceed forward with additional labs to further evaluate including BCR-ABL which fortunately was negative.   Today she reports that she has been well since her last visit. She has no concerns today.   She denies any recurrent illness, excessive fatigue, enlarged lymph nodes, fevers, night sweats, unintentional weight loss  There has been no bleeding to her knowledge: denies epistaxis, gingivitis, hemoptysis, hematemesis, hematuria, melena, excessive bruising, blood donation.    Wt Readings from Last 3 Encounters:  10/30/23 130 lb 1.6 oz (59 kg)  10/02/23 129 lb 12.8 oz (58.9 kg)  11/02/21 150 lb (68 kg)     Medications:   Current Outpatient Medications:    aspirin  EC 81 MG tablet, Take 81 mg by mouth daily. States last dose will be 07/23/12, Disp: , Rfl:    Calcium Citrate-Vitamin D 200-3.125 MG-MCG TABS, Take 1 tablet by mouth daily., Disp: , Rfl:    Coenzyme Q10 (CO Q 10 PO), Take 120 mg by mouth daily., Disp: , Rfl:    lisinopril  (PRINIVIL ,ZESTRIL ) 5 MG tablet, Take 5 mg by mouth daily. , Disp: , Rfl:    metFORMIN (GLUCOPHAGE) 500 MG tablet, Take 500 mg by mouth 2 (two) times daily., Disp: , Rfl:    Multiple Vitamin  (MULTIVITAMIN) tablet, Take 1 tablet by mouth daily., Disp: , Rfl:    omeprazole (PRILOSEC) 40 MG capsule, Take 40 mg by mouth daily., Disp: , Rfl:    ondansetron  (ZOFRAN ) 4 MG tablet, Take 4 mg by mouth daily as needed for nausea., Disp: , Rfl:    pravastatin (PRAVACHOL) 80 MG tablet, Take 80 mg by mouth daily., Disp: , Rfl:    pregabalin (LYRICA) 50 MG capsule, Take 50 mg by mouth 2 (two) times daily., Disp: , Rfl:    rOPINIRole (REQUIP) 0.5 MG tablet, Take 0.5 mg by mouth at bedtime., Disp: , Rfl:    venlafaxine XR (EFFEXOR-XR) 150 MG 24 hr capsule, Take 150 mg by mouth daily., Disp: , Rfl:    zolpidem  (AMBIEN ) 5 MG tablet, Take 5 mg by mouth at bedtime as needed., Disp: , Rfl:   Allergies:  Allergies  Allergen Reactions   Percocet [Oxycodone-Acetaminophen ] Nausea Only    Past Medical History, Surgical history, Social history, and Family History were reviewed and updated.  Review of Systems: Review of Systems  Constitutional: Negative.   HENT:  Negative.    Eyes: Negative.   Respiratory: Negative.    Cardiovascular: Negative.   Gastrointestinal: Negative.   Endocrine: Negative.   Genitourinary: Negative.    Musculoskeletal: Negative.   Skin: Negative.   Neurological: Negative.   Hematological: Negative.   Psychiatric/Behavioral: Negative.  Physical Exam:  height is 4\' 11"  (1.499 m) and weight is 130 lb 1.6 oz (59 kg). Her oral temperature is 97.7 F (36.5 C). Her blood pressure is 114/70 and her pulse is 87. Her respiration is 18 and oxygen saturation is 96%.   Physical Exam General: NAD Cardiovascular: regular rate and rhythm Pulmonary: clear ant fields Abdomen: soft, nontender, + bowel sounds GU: no suprapubic tenderness Extremities: no edema, no joint deformities Skin: no rashes Neurological: Weakness but otherwise nonfocal   Lab Results  Component Value Date   WBC 10.2 10/30/2023   HGB 13.6 10/30/2023   HCT 41.1 10/30/2023   MCV 91.5 10/30/2023    PLT 389 10/30/2023     Chemistry      Component Value Date/Time   NA 141 10/30/2023 1125   K 4.5 10/30/2023 1125   CL 105 10/30/2023 1125   CO2 28 10/30/2023 1125   BUN 12 10/30/2023 1125   CREATININE 0.82 10/30/2023 1125      Component Value Date/Time   CALCIUM 9.5 10/30/2023 1125   ALKPHOS 85 10/30/2023 1125   AST 16 10/30/2023 1125   ALT 13 10/30/2023 1125   BILITOT 0.4 10/30/2023 1125     Encounter Diagnosis  Name Primary?   Leukocytosis, unspecified type Yes    Assessment and Plan- Patient is a 78 y.o. female who we follow for mild leukocytosis which is felt to be secondary to inflammation. She has had a negative BCR-ABL work up.   Today her CBC is normal. Review of labs from one month ago also shows a normal WBC.  CMP normal (aside from her glucose which she is working on with her PCP) LDH is normal/stable.     Disposition: RTC 12 months APP, labs (CBC w/, CMP, LDH)   Sunnie England PA-C 5/28/202512:56 PM

## 2024-10-28 ENCOUNTER — Other Ambulatory Visit

## 2024-10-28 ENCOUNTER — Inpatient Hospital Stay: Attending: Medical Oncology | Admitting: Medical Oncology
